# Patient Record
Sex: Female | Born: 1975 | Race: White | Hispanic: No | Marital: Married | Smoking: Never smoker
Health system: Southern US, Community
[De-identification: ages and names within clinical notes are randomized; demographics above are authoritative.]

## PROBLEM LIST (undated history)

## (undated) DIAGNOSIS — F419 Anxiety disorder, unspecified: Secondary | ICD-10-CM

## (undated) DIAGNOSIS — K8020 Calculus of gallbladder without cholecystitis without obstruction: Secondary | ICD-10-CM

## (undated) DIAGNOSIS — K219 Gastro-esophageal reflux disease without esophagitis: Secondary | ICD-10-CM

## (undated) DIAGNOSIS — F329 Major depressive disorder, single episode, unspecified: Secondary | ICD-10-CM

## (undated) HISTORY — DX: Anxiety disorder, unspecified: F41.9

## (undated) HISTORY — DX: Major depressive disorder, single episode, unspecified: F32.9

## (undated) HISTORY — DX: Calculus of gallbladder without cholecystitis without obstruction: K80.20

---

## 1998-05-27 ENCOUNTER — Other Ambulatory Visit: Admission: RE | Admit: 1998-05-27 | Discharge: 1998-05-27 | Payer: Self-pay | Admitting: Gynecology

## 1998-05-27 ENCOUNTER — Other Ambulatory Visit: Admission: RE | Admit: 1998-05-27 | Discharge: 1998-05-27 | Payer: Self-pay | Admitting: *Deleted

## 1998-11-21 ENCOUNTER — Other Ambulatory Visit: Admission: RE | Admit: 1998-11-21 | Discharge: 1998-11-21 | Payer: Self-pay | Admitting: Gynecology

## 2000-07-01 ENCOUNTER — Other Ambulatory Visit: Admission: RE | Admit: 2000-07-01 | Discharge: 2000-07-01 | Payer: Self-pay | Admitting: *Deleted

## 2001-05-25 HISTORY — PX: COLPOSCOPY: SHX161

## 2004-08-06 ENCOUNTER — Other Ambulatory Visit: Admission: RE | Admit: 2004-08-06 | Discharge: 2004-08-06 | Payer: Self-pay | Admitting: Obstetrics and Gynecology

## 2005-08-12 ENCOUNTER — Other Ambulatory Visit: Admission: RE | Admit: 2005-08-12 | Discharge: 2005-08-12 | Payer: Self-pay | Admitting: Obstetrics and Gynecology

## 2007-09-11 ENCOUNTER — Inpatient Hospital Stay (HOSPITAL_COMMUNITY): Admission: AD | Admit: 2007-09-11 | Discharge: 2007-09-13 | Payer: Self-pay | Admitting: Obstetrics and Gynecology

## 2009-10-25 ENCOUNTER — Inpatient Hospital Stay (HOSPITAL_COMMUNITY): Admission: AD | Admit: 2009-10-25 | Discharge: 2009-10-25 | Payer: Self-pay | Admitting: Obstetrics and Gynecology

## 2009-12-08 ENCOUNTER — Inpatient Hospital Stay (HOSPITAL_COMMUNITY): Admission: AD | Admit: 2009-12-08 | Discharge: 2009-12-09 | Payer: Self-pay | Admitting: Obstetrics and Gynecology

## 2010-05-25 DIAGNOSIS — F419 Anxiety disorder, unspecified: Secondary | ICD-10-CM

## 2010-05-25 DIAGNOSIS — F32A Depression, unspecified: Secondary | ICD-10-CM

## 2010-05-25 HISTORY — DX: Anxiety disorder, unspecified: F41.9

## 2010-05-25 HISTORY — DX: Depression, unspecified: F32.A

## 2010-08-09 LAB — CBC
HCT: 32 % — ABNORMAL LOW (ref 36.0–46.0)
HCT: 40.8 % (ref 36.0–46.0)
Hemoglobin: 10.9 g/dL — ABNORMAL LOW (ref 12.0–15.0)
Hemoglobin: 13.8 g/dL (ref 12.0–15.0)
MCH: 30.8 pg (ref 26.0–34.0)
MCH: 31.2 pg (ref 26.0–34.0)
MCHC: 33.9 g/dL (ref 30.0–36.0)
MCHC: 34.2 g/dL (ref 30.0–36.0)
MCV: 90.8 fL (ref 78.0–100.0)
MCV: 91.2 fL (ref 78.0–100.0)
Platelets: 222 10*3/uL (ref 150–400)
Platelets: 293 10*3/uL (ref 150–400)
RBC: 3.51 MIL/uL — ABNORMAL LOW (ref 3.87–5.11)
RBC: 4.49 MIL/uL (ref 3.87–5.11)
RDW: 14 % (ref 11.5–15.5)
RDW: 14.1 % (ref 11.5–15.5)
WBC: 12.9 10*3/uL — ABNORMAL HIGH (ref 4.0–10.5)
WBC: 13.3 10*3/uL — ABNORMAL HIGH (ref 4.0–10.5)

## 2010-08-09 LAB — RPR: RPR Ser Ql: NONREACTIVE

## 2010-08-11 LAB — URINALYSIS, ROUTINE W REFLEX MICROSCOPIC
Bilirubin Urine: NEGATIVE
Glucose, UA: NEGATIVE mg/dL
Hgb urine dipstick: NEGATIVE
Ketones, ur: NEGATIVE mg/dL
Nitrite: NEGATIVE
Protein, ur: NEGATIVE mg/dL
Specific Gravity, Urine: <1.005 — ABNORMAL LOW (ref 1.005–1.030)
Urobilinogen, UA: 0.2 mg/dL (ref 0.0–1.0)
pH: 6.5 (ref 5.0–8.0)

## 2010-08-11 LAB — FETAL FIBRONECTIN: Fetal Fibronectin: NEGATIVE

## 2011-02-17 LAB — CBC
HCT: 30.5 — ABNORMAL LOW
HCT: 37.5
Hemoglobin: 10.7 — ABNORMAL LOW
Hemoglobin: 13
MCHC: 34.8
MCHC: 35
MCV: 90.1
MCV: 90.5
Platelets: 242
Platelets: 301
RBC: 3.37 — ABNORMAL LOW
RBC: 4.16
RDW: 14
RDW: 14.1
WBC: 13.2 — ABNORMAL HIGH
WBC: 18.4 — ABNORMAL HIGH

## 2011-02-17 LAB — COMPREHENSIVE METABOLIC PANEL
ALT: 13
AST: 22
Albumin: 2.9 — ABNORMAL LOW
Alkaline Phosphatase: 157 — ABNORMAL HIGH
BUN: 8
CO2: 22
Calcium: 9.4
Chloride: 107
Creatinine, Ser: 0.65
GFR calc Af Amer: >60
GFR calc non Af Amer: >60
Glucose, Bld: 76
Potassium: 4
Sodium: 135
Total Bilirubin: 0.7
Total Protein: 6.2

## 2011-02-17 LAB — URINALYSIS, ROUTINE W REFLEX MICROSCOPIC
Bilirubin Urine: NEGATIVE
Glucose, UA: NEGATIVE
Hgb urine dipstick: NEGATIVE
Ketones, ur: NEGATIVE
Nitrite: NEGATIVE
Protein, ur: NEGATIVE
Specific Gravity, Urine: 1.01
Urobilinogen, UA: 0.2
pH: 7

## 2011-02-17 LAB — RPR: RPR Ser Ql: NONREACTIVE

## 2011-02-17 LAB — LACTATE DEHYDROGENASE: LDH: 148

## 2011-02-17 LAB — URIC ACID: Uric Acid, Serum: 6.3

## 2012-09-27 ENCOUNTER — Encounter (HOSPITAL_COMMUNITY): Payer: Self-pay | Admitting: Emergency Medicine

## 2012-09-27 ENCOUNTER — Emergency Department (HOSPITAL_COMMUNITY): Payer: BC Managed Care – PPO

## 2012-09-27 ENCOUNTER — Inpatient Hospital Stay (HOSPITAL_COMMUNITY)
Admission: EM | Admit: 2012-09-27 | Discharge: 2012-09-30 | DRG: 560 | Disposition: A | Discharge status: Home / Self Care | Payer: BC Managed Care – PPO | Attending: Surgery | Admitting: Surgery

## 2012-09-27 DIAGNOSIS — S22039A Unspecified fracture of third thoracic vertebra, initial encounter for closed fracture: Secondary | ICD-10-CM

## 2012-09-27 DIAGNOSIS — S22049A Unspecified fracture of fourth thoracic vertebra, initial encounter for closed fracture: Secondary | ICD-10-CM

## 2012-09-27 DIAGNOSIS — J942 Hemothorax: Secondary | ICD-10-CM | POA: Diagnosis present

## 2012-09-27 DIAGNOSIS — S22009A Unspecified fracture of unspecified thoracic vertebra, initial encounter for closed fracture: Principal | ICD-10-CM | POA: Diagnosis present

## 2012-09-27 DIAGNOSIS — S272XXA Traumatic hemopneumothorax, initial encounter: Secondary | ICD-10-CM | POA: Diagnosis present

## 2012-09-27 DIAGNOSIS — S199XXA Unspecified injury of neck, initial encounter: Secondary | ICD-10-CM

## 2012-09-27 LAB — CBC WITH DIFFERENTIAL/PLATELET
Band Neutrophils: 7 % (ref 0–10)
Basophils Absolute: 0 10*3/uL (ref 0.0–0.1)
Basophils Relative: 0 % (ref 0–1)
Blasts: 0 %
Eosinophils Absolute: 0 10*3/uL (ref 0.0–0.7)
Eosinophils Relative: 0 % (ref 0–5)
HCT: 37.4 % (ref 36.0–46.0)
Hemoglobin: 13.1 g/dL (ref 12.0–15.0)
Lymphocytes Relative: 5 % — ABNORMAL LOW (ref 12–46)
Lymphs Abs: 1.4 10*3/uL (ref 0.7–4.0)
MCH: 30 pg (ref 26.0–34.0)
MCHC: 35 g/dL (ref 30.0–36.0)
MCV: 85.8 fL (ref 78.0–100.0)
Metamyelocytes Relative: 0 %
Monocytes Absolute: 1.4 10*3/uL — ABNORMAL HIGH (ref 0.1–1.0)
Monocytes Relative: 5 % (ref 3–12)
Myelocytes: 2 %
Neutro Abs: 25.2 10*3/uL — ABNORMAL HIGH (ref 1.7–7.7)
Neutrophils Relative %: 81 % — ABNORMAL HIGH (ref 43–77)
Platelets: 307 10*3/uL (ref 150–400)
Promyelocytes Absolute: 0 %
RBC: 4.36 MIL/uL (ref 3.87–5.11)
RDW: 13.1 % (ref 11.5–15.5)
WBC: 28 10*3/uL — ABNORMAL HIGH (ref 4.0–10.5)
nRBC: 0 /100 WBC

## 2012-09-27 LAB — COMPREHENSIVE METABOLIC PANEL
ALT: 16 U/L (ref 0–35)
AST: 28 U/L (ref 0–37)
Albumin: 4.6 g/dL (ref 3.5–5.2)
Alkaline Phosphatase: 53 U/L (ref 39–117)
BUN: 15 mg/dL (ref 6–23)
CO2: 21 mEq/L (ref 19–32)
Calcium: 9 mg/dL (ref 8.4–10.5)
Chloride: 104 mEq/L (ref 96–112)
Creatinine, Ser: 0.7 mg/dL (ref 0.50–1.10)
GFR calc Af Amer: >90 mL/min (ref >90)
GFR calc non Af Amer: >90 mL/min (ref >90)
Glucose, Bld: 196 mg/dL — ABNORMAL HIGH (ref 70–99)
Potassium: 2.7 mEq/L — CL (ref 3.5–5.1)
Sodium: 139 mEq/L (ref 135–145)
Total Bilirubin: 1.1 mg/dL (ref 0.3–1.2)
Total Protein: 7.2 g/dL (ref 6.0–8.3)

## 2012-09-27 MED ORDER — ONDANSETRON HCL 4 MG/2ML IJ SOLN
4.0000 mg | Freq: Once | INTRAMUSCULAR | Status: AC
  Administered 2012-09-27: 4 mg via INTRAVENOUS

## 2012-09-27 MED ORDER — POTASSIUM CHLORIDE CRYS ER 20 MEQ PO TBCR
40.0000 meq | EXTENDED_RELEASE_TABLET | Freq: Once | ORAL | Status: AC
  Administered 2012-09-27: 40 meq via ORAL
  Filled 2012-09-27: qty 2

## 2012-09-27 MED ORDER — FENTANYL CITRATE 0.05 MG/ML IJ SOLN
50.0000 ug | Freq: Once | INTRAMUSCULAR | Status: AC
  Administered 2012-09-27: 50 ug via INTRAVENOUS
  Filled 2012-09-27: qty 2

## 2012-09-27 MED ORDER — SODIUM CHLORIDE 0.9 % IV BOLUS (SEPSIS)
500.0000 mL | Freq: Once | INTRAVENOUS | Status: AC
  Administered 2012-09-27: 500 mL via INTRAVENOUS

## 2012-09-27 MED ORDER — ONDANSETRON HCL 4 MG/2ML IJ SOLN
INTRAMUSCULAR | Status: AC
Start: 2012-09-27 — End: 2012-09-27
  Administered 2012-09-27: 21:00:00
  Filled 2012-09-27: qty 2

## 2012-09-27 MED ORDER — HYDROMORPHONE HCL PF 1 MG/ML IJ SOLN
1.0000 mg | Freq: Once | INTRAMUSCULAR | Status: AC
  Administered 2012-09-27: 1 mg via INTRAVENOUS
  Filled 2012-09-27: qty 1

## 2012-09-27 MED ORDER — ONDANSETRON HCL 4 MG/2ML IJ SOLN
4.0000 mg | Freq: Once | INTRAMUSCULAR | Status: AC
  Administered 2012-09-27: 4 mg via INTRAVENOUS
  Filled 2012-09-27: qty 2

## 2012-09-27 MED ORDER — IOHEXOL 300 MG/ML  SOLN
100.0000 mL | Freq: Once | INTRAMUSCULAR | Status: AC | PRN
  Administered 2012-09-27: 100 mL via INTRAVENOUS

## 2012-09-27 MED ORDER — POTASSIUM CHLORIDE 10 MEQ/100ML IV SOLN
10.0000 meq | INTRAVENOUS | Status: DC
  Administered 2012-09-27: 10 meq via INTRAVENOUS
  Filled 2012-09-27 (×2): qty 100

## 2012-09-27 MED ORDER — SODIUM CHLORIDE 0.9 % IV SOLN
INTRAVENOUS | Status: DC
  Administered 2012-09-27 – 2012-09-29 (×4): via INTRAVENOUS
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
## 2012-09-27 NOTE — ED Notes (Signed)
Critical lab potassium 2.7, Dr. Effie Shy notified.
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
## 2012-09-28 ENCOUNTER — Encounter (HOSPITAL_COMMUNITY): Payer: Self-pay | Admitting: Surgery

## 2012-09-28 DIAGNOSIS — S271XXA Traumatic hemothorax, initial encounter: Secondary | ICD-10-CM

## 2012-09-28 DIAGNOSIS — S22009A Unspecified fracture of unspecified thoracic vertebra, initial encounter for closed fracture: Secondary | ICD-10-CM

## 2012-09-28 LAB — CBC
HCT: 35.6 % — ABNORMAL LOW (ref 36.0–46.0)
Hemoglobin: 11.8 g/dL — ABNORMAL LOW (ref 12.0–15.0)
MCH: 28.6 pg (ref 26.0–34.0)
MCHC: 33.1 g/dL (ref 30.0–36.0)
MCV: 86.4 fL (ref 78.0–100.0)
Platelets: 275 10*3/uL (ref 150–400)
RBC: 4.12 MIL/uL (ref 3.87–5.11)
RDW: 13.4 % (ref 11.5–15.5)
WBC: 13.1 10*3/uL — ABNORMAL HIGH (ref 4.0–10.5)

## 2012-09-28 LAB — COMPREHENSIVE METABOLIC PANEL
ALT: 14 U/L (ref 0–35)
AST: 21 U/L (ref 0–37)
Albumin: 3.9 g/dL (ref 3.5–5.2)
Alkaline Phosphatase: 48 U/L (ref 39–117)
BUN: 13 mg/dL (ref 6–23)
CO2: 23 mEq/L (ref 19–32)
Calcium: 9.1 mg/dL (ref 8.4–10.5)
Chloride: 108 mEq/L (ref 96–112)
Creatinine, Ser: 0.65 mg/dL (ref 0.50–1.10)
GFR calc Af Amer: >90 mL/min (ref >90)
GFR calc non Af Amer: >90 mL/min (ref >90)
Glucose, Bld: 111 mg/dL — ABNORMAL HIGH (ref 70–99)
Potassium: 3.8 mEq/L (ref 3.5–5.1)
Sodium: 139 mEq/L (ref 135–145)
Total Bilirubin: 1.2 mg/dL (ref 0.3–1.2)
Total Protein: 6.7 g/dL (ref 6.0–8.3)

## 2012-09-28 LAB — URINALYSIS, ROUTINE W REFLEX MICROSCOPIC
Bilirubin Urine: NEGATIVE
Glucose, UA: NEGATIVE mg/dL
Hgb urine dipstick: NEGATIVE
Ketones, ur: 40 mg/dL — AB
Leukocytes, UA: NEGATIVE
Nitrite: NEGATIVE
Protein, ur: NEGATIVE mg/dL
Specific Gravity, Urine: >1.046 — ABNORMAL HIGH (ref 1.005–1.030)
Urobilinogen, UA: 0.2 mg/dL (ref 0.0–1.0)
pH: 5.5 (ref 5.0–8.0)

## 2012-09-28 MED ORDER — LORAZEPAM BOLUS VIA INFUSION: 1.0000 mg | INTRAVENOUS | Status: DC | PRN

## 2012-09-28 MED ORDER — LORAZEPAM 2 MG/ML IJ SOLN
1.0000 mg | INTRAMUSCULAR | Status: DC | PRN
  Administered 2012-09-28 – 2012-09-29 (×3): 1 mg via INTRAVENOUS
  Filled 2012-09-28 (×3): qty 1

## 2012-09-28 MED ORDER — ONDANSETRON HCL 4 MG PO TABS
4.0000 mg | ORAL_TABLET | Freq: Four times a day (QID) | ORAL | Status: DC | PRN
  Administered 2012-09-30: 4 mg via ORAL
  Filled 2012-09-28: qty 1

## 2012-09-28 MED ORDER — ONDANSETRON HCL 4 MG/2ML IJ SOLN
4.0000 mg | Freq: Four times a day (QID) | INTRAMUSCULAR | Status: DC | PRN
  Administered 2012-09-28 – 2012-09-30 (×3): 4 mg via INTRAVENOUS
  Filled 2012-09-28 (×5): qty 2

## 2012-09-28 MED ORDER — HYDROMORPHONE HCL PF 1 MG/ML IJ SOLN
1.0000 mg | INTRAMUSCULAR | Status: AC
  Administered 2012-09-28: 1 mg via INTRAVENOUS
  Filled 2012-09-28: qty 1

## 2012-09-28 MED ORDER — MORPHINE SULFATE 2 MG/ML IJ SOLN
2.0000 mg | INTRAMUSCULAR | Status: DC | PRN
  Administered 2012-09-28 – 2012-09-30 (×4): 2 mg via INTRAVENOUS
  Filled 2012-09-28 (×4): qty 1

## 2012-09-28 MED ORDER — PANTOPRAZOLE SODIUM 40 MG PO TBEC
40.0000 mg | DELAYED_RELEASE_TABLET | Freq: Every day | ORAL | Status: DC
  Administered 2012-09-29 – 2012-09-30 (×2): 40 mg via ORAL
  Filled 2012-09-28 (×3): qty 1

## 2012-09-28 MED ORDER — HYDROMORPHONE HCL PF 1 MG/ML IJ SOLN
1.0000 mg | INTRAMUSCULAR | Status: DC | PRN
  Administered 2012-09-28: 1 mg via INTRAVENOUS

## 2012-09-28 MED ORDER — PANTOPRAZOLE SODIUM 40 MG IV SOLR
40.0000 mg | Freq: Every day | INTRAVENOUS | Status: DC
  Administered 2012-09-28: 40 mg via INTRAVENOUS
  Filled 2012-09-28 (×3): qty 40

## 2012-09-28 MED ORDER — OXYCODONE HCL 5 MG PO TABS
10.0000 mg | ORAL_TABLET | ORAL | Status: DC | PRN
  Administered 2012-09-28 – 2012-09-30 (×9): 10 mg via ORAL
  Filled 2012-09-28 (×9): qty 2

## 2012-09-28 MED ORDER — HYDROMORPHONE HCL PF 1 MG/ML IJ SOLN: 1.0000 mg | INTRAMUSCULAR | Status: DC | PRN

## 2012-09-28 MED ORDER — ONDANSETRON HCL 4 MG/2ML IJ SOLN
INTRAMUSCULAR | Status: AC
  Administered 2012-09-28: 4 mg via INTRAVENOUS
  Filled 2012-09-28: qty 2

## 2012-09-28 MED ORDER — ONDANSETRON HCL 4 MG/2ML IJ SOLN: 4.0000 mg | Freq: Once | INTRAMUSCULAR | Status: AC

## 2012-09-28 MED ORDER — HYDROMORPHONE HCL PF 1 MG/ML IJ SOLN
1.0000 mg | INTRAMUSCULAR | Status: DC | PRN
  Filled 2012-09-28: qty 1

## 2012-09-28 NOTE — Progress Notes (Signed)
Patient admitted early this morning.  To get repeat CT with higher resolution and finer cuts.  Nausea with pain medication.  Unlikely to require surgery, so will allow the patient to eat.  Marta Lamas. Gae Bon, MD, FACS [phone] Trauma Surgeon

## 2012-09-28 NOTE — Progress Notes (Signed)
UR completed 

## 2012-09-28 NOTE — Progress Notes (Signed)
Patient ID: Stacey Foster, female   DOB: Oct 14, 1975, 37 y.o.   MRN: [id number] Ct thoracic seen. No need for surgery . On an aspen brace . Will get the thoracic extension. Spoke with her and parents. i wii repeat the ct thoracic in 3 weeks. Ct seen with drs Cabbell and Lovell Sheehan
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
## 2012-09-28 NOTE — Progress Notes (Signed)
Patient ID: Stacey Foster, female   DOB: 1976-01-31, 37 y.o.   MRN: [id number] Neuro stable. C/o soreness all over. No neuro abnormalities. Ct thoracic spine ordered by trauma to be sure of the fractures and the need for surgical intervention.
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
## 2012-09-29 ENCOUNTER — Inpatient Hospital Stay (HOSPITAL_COMMUNITY): Payer: BC Managed Care – PPO

## 2012-09-29 MED ORDER — TIZANIDINE HCL 4 MG PO TABS
4.0000 mg | ORAL_TABLET | Freq: Three times a day (TID) | ORAL | Status: DC | PRN
  Filled 2012-09-29: qty 1
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
## 2012-09-29 NOTE — Progress Notes (Signed)
Patient ID: Stacey Foster, female   DOB: April 28, 1976, 37 y.o.   MRN: [id number] Neuro stable. No weakness. To get the thoracic extension for the aspen collar. Pt to get her oob. Lateral ap xrays of thoracic spine. If normal she can go home with the brace and i will see he in 4 weeks.
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
## 2012-09-29 NOTE — Progress Notes (Signed)
Orthopedic Tech Progress Note Patient Details:  Stacey Foster [birth year] [id number]  Patient ID: Brand Males, female   DOB: December 04, 1975, 37 y.o.   MRN: [id number]   [name] 09/29/2012, 2:07 PM CALLED [facility] FOR CTO BRACE.
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
## 2012-09-29 NOTE — Progress Notes (Signed)
Orthopedic Tech Progress Note Patient Details:  Stacey Foster [birth year] [id number]  Patient ID: Brand Males, female   DOB: [birth year], 37 y.o.   MRN: [id number]   [name] 09/29/2012, 2:08 PM CTO COMPLETED BY [facility] .

## 2012-09-30 ENCOUNTER — Inpatient Hospital Stay (HOSPITAL_COMMUNITY): Payer: BC Managed Care – PPO

## 2012-09-30 MED ORDER — ACETAMINOPHEN 325 MG PO TABS: 650.0000 mg | ORAL_TABLET | Freq: Four times a day (QID) | ORAL | Status: DC | PRN

## 2012-09-30 MED ORDER — ONDANSETRON HCL 4 MG PO TABS: 4.0000 mg | ORAL_TABLET | Freq: Four times a day (QID) | ORAL | Status: DC | PRN

## 2012-09-30 MED ORDER — TIZANIDINE HCL 4 MG PO TABS: 4.0000 mg | ORAL_TABLET | Freq: Three times a day (TID) | ORAL | Status: DC | PRN

## 2012-09-30 MED ORDER — OXYCODONE HCL 10 MG PO TABS: 5.0000 mg | ORAL_TABLET | ORAL | Status: DC | PRN
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
## 2012-09-30 NOTE — Progress Notes (Signed)
Patient ID: Stacey Foster, female   DOB: [birth year], 37 y.o.   MRN: [id number] Doing well. C/o neck pain. Neuro normal. Thoracic spine xrays shows good aligment. Spoke to her and mother and answered their ? Ok to be discharge with pain medication and muscle relaxres. To see me in 4 weeks. Will order a ct spine prior to the visit
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
## 2012-09-30 NOTE — Progress Notes (Signed)
She is doing fine.  Probably home after X-rays of T-spine. This patient has been seen and I agree with the findings and treatment plan.  Marta Lamas. Gae Bon, MD, FACS [phone] (pager) [phone] (direct pager) Trauma Surgeon

## 2012-09-30 NOTE — Progress Notes (Signed)
Patient discharge education complete and prescription for narcotic pain medication given.  IV removed and patient leaving with mother and husband. Lance Bosch, RN

## 2012-09-30 NOTE — Progress Notes (Signed)
Occupational Therapy Treatment Patient Details Name: ESSA WENK MRN: [id number] DOB: [birth year] Today's Date: 09/30/2012 Time: 7829-5621 OT Time Calculation (min): 14 min  OT Assessment / Plan / Recommendation Comments on Treatment Session Goals met.  Pt preparing to d/c later today. Signing off.    Follow Up Recommendations  No OT follow up;Supervision/Assistance - 24 hour    Barriers to Discharge       Equipment Recommendations  None recommended by OT    Recommendations for Other Services    Frequency Min 2X/week   Plan All goals met and education completed, patient discharged from OT services;Discharge plan remains appropriate    Precautions / Restrictions Precautions Precautions: Cervical;Back Precaution Comments: Pt educated on cervical/back precautions to assist in minimizing pain. Required Braces or Orthoses: Cervical Brace Cervical Brace: Hard collar (with thoracic extension)   Pertinent Vitals/Pain See vitals    ADL  Eating/Feeding: Performed;Modified independent Where Assessed - Eating/Feeding: Edge of bed Grooming: Performed;Brushing hair;Wash/dry face;Modified independent Where Assessed - Grooming: Unsupported standing Toilet Transfer: Simulated;Modified independent Toilet Transfer Method: Sit to Barista:  (bed) Equipment Used:  (cervical collar with thoracic extension) Transfers/Ambulation Related to ADLs: mod I within room ADL Comments: Wearing schedule for cervical collar w/ thoracic ext. not specified in chart orders or notes. RN Tobi Bastos called Dr Cassandria Santee office and received confirmation that pt only needs to wear brace when up ambulating.  Pt able to don/doff brace in sitting with min assist and her mother independent in assisting.  Edcuated pt/mother on adjusting/repositioning brace by tightening or loosing side straps as needed. Otherwise brace settings fixed specifically for pt. Pt verbalized understanding.  Also discussed body  positioning in bed when on side and on back with use of pillows to maximize comfort and support.     OT Diagnosis:    OT Problem List:   OT Treatment Interventions:     OT Goals Miscellaneous OT Goals Miscellaneous OT Goal #1: Pt will be able to don/doff cervical collar wtih thoracic extension with min assist and caregiver I in assisting.  OT Goal: Miscellaneous Goal #1 - Progress: Met  Visit Information  Last OT Received On: 09/30/12    Subjective Data      Prior Functioning       Cognition  Cognition Arousal/Alertness: Awake/alert Behavior During Therapy: WFL for tasks assessed/performed Overall Cognitive Status: Within Functional Limits for tasks assessed    Mobility  Bed Mobility Bed Mobility: Not assessed Transfers Transfers: Sit to Stand;Stand to Sit Sit to Stand: 6: Modified independent (Device/Increase time);From bed Stand to Sit: 6: Modified independent (Device/Increase time);To bed    Exercises      Balance     End of Session OT - End of Session Equipment Utilized During Treatment: Cervical collar Activity Tolerance: Patient tolerated treatment well Patient left: in bed;with call bell/phone within reach;with family/visitor present;with nursing in room Nurse Communication:  (brace wearing schedule)  GO    09/30/2012 Cipriano Mile OTR/L Pager [phone] Office [phone]  Cipriano Mile 09/30/2012, 12:06 PM

## 2012-10-31 ENCOUNTER — Other Ambulatory Visit: Payer: Self-pay | Admitting: Neurosurgery

## 2012-10-31 DIAGNOSIS — S22000S Wedge compression fracture of unspecified thoracic vertebra, sequela: Secondary | ICD-10-CM

## 2012-11-08 ENCOUNTER — Ambulatory Visit
Admission: RE | Admit: 2012-11-08 | Discharge: 2012-11-08 | Disposition: A | Discharge status: Home / Self Care | Payer: BC Managed Care – PPO | Source: Ambulatory Visit | Attending: Neurosurgery | Admitting: Neurosurgery

## 2012-11-08 DIAGNOSIS — S22000S Wedge compression fracture of unspecified thoracic vertebra, sequela: Secondary | ICD-10-CM

## 2012-11-18 ENCOUNTER — Other Ambulatory Visit: Payer: Self-pay | Admitting: Neurosurgery

## 2012-11-18 DIAGNOSIS — S22009A Unspecified fracture of unspecified thoracic vertebra, initial encounter for closed fracture: Secondary | ICD-10-CM

## 2012-12-13 ENCOUNTER — Other Ambulatory Visit: Payer: BC Managed Care – PPO

## 2012-12-15 ENCOUNTER — Ambulatory Visit
Admission: RE | Admit: 2012-12-15 | Discharge: 2012-12-15 | Disposition: A | Discharge status: Home / Self Care | Payer: BC Managed Care – PPO | Source: Ambulatory Visit | Attending: Neurosurgery | Admitting: Neurosurgery

## 2012-12-15 DIAGNOSIS — S22009A Unspecified fracture of unspecified thoracic vertebra, initial encounter for closed fracture: Secondary | ICD-10-CM

## 2013-02-08 ENCOUNTER — Encounter: Payer: Self-pay | Admitting: Obstetrics and Gynecology

## 2013-02-09 ENCOUNTER — Other Ambulatory Visit: Payer: Self-pay | Admitting: Neurosurgery

## 2013-02-14 ENCOUNTER — Ambulatory Visit
Admission: RE | Admit: 2013-02-14 | Discharge: 2013-02-14 | Disposition: A | Discharge status: Home / Self Care | Payer: BC Managed Care – PPO | Source: Ambulatory Visit | Attending: Neurosurgery | Admitting: Neurosurgery

## 2013-03-02 ENCOUNTER — Other Ambulatory Visit: Payer: Self-pay | Admitting: Neurosurgery

## 2013-03-02 DIAGNOSIS — M549 Dorsalgia, unspecified: Secondary | ICD-10-CM

## 2013-03-02 DIAGNOSIS — M542 Cervicalgia: Secondary | ICD-10-CM

## 2013-03-04 ENCOUNTER — Ambulatory Visit
Admission: RE | Admit: 2013-03-04 | Discharge: 2013-03-04 | Disposition: A | Discharge status: Home / Self Care | Payer: BC Managed Care – PPO | Source: Ambulatory Visit | Attending: Neurosurgery | Admitting: Neurosurgery

## 2013-03-04 DIAGNOSIS — M549 Dorsalgia, unspecified: Secondary | ICD-10-CM

## 2013-03-04 DIAGNOSIS — M542 Cervicalgia: Secondary | ICD-10-CM

## 2013-03-17 ENCOUNTER — Ambulatory Visit (INDEPENDENT_AMBULATORY_CARE_PROVIDER_SITE_OTHER): Payer: BC Managed Care – PPO | Admitting: Obstetrics and Gynecology

## 2013-03-17 ENCOUNTER — Encounter: Payer: Self-pay | Admitting: Obstetrics and Gynecology

## 2013-03-17 VITALS — BP 120/76 | HR 76 | Resp 16 | Ht 65.5 in | Wt 153.5 lb

## 2013-03-17 DIAGNOSIS — Z01419 Encounter for gynecological examination (general) (routine) without abnormal findings: Secondary | ICD-10-CM

## 2013-03-17 DIAGNOSIS — Z0000 Encounter for general adult medical examination without abnormal findings: Secondary | ICD-10-CM

## 2013-03-17 LAB — POCT URINALYSIS DIPSTICK
Bilirubin, UA: NEGATIVE
Blood, UA: NEGATIVE
Glucose, UA: NEGATIVE
Ketones, UA: NEGATIVE
Leukocytes, UA: NEGATIVE
Nitrite, UA: NEGATIVE
Protein, UA: NEGATIVE
Urobilinogen, UA: NEGATIVE
pH, UA: 5

## 2013-03-17 LAB — HEMOGLOBIN, FINGERSTICK: Hemoglobin, fingerstick: 13.7 g/dL (ref 12.0–16.0)
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
## 2013-03-17 NOTE — Patient Instructions (Signed)

## 2013-03-20 ENCOUNTER — Encounter: Payer: Self-pay | Admitting: Obstetrics and Gynecology

## 2013-03-21 LAB — IPS PAP TEST WITH HPV

## 2013-08-18 ENCOUNTER — Other Ambulatory Visit: Payer: Self-pay | Admitting: Neurosurgery

## 2013-08-18 ENCOUNTER — Ambulatory Visit
Admission: RE | Admit: 2013-08-18 | Discharge: 2013-08-18 | Disposition: A | Discharge status: Home / Self Care | Payer: BC Managed Care – PPO | Source: Ambulatory Visit | Attending: Neurosurgery | Admitting: Neurosurgery

## 2013-08-18 DIAGNOSIS — S22009A Unspecified fracture of unspecified thoracic vertebra, initial encounter for closed fracture: Secondary | ICD-10-CM

## 2014-01-23 ENCOUNTER — Encounter: Payer: Self-pay | Admitting: Obstetrics and Gynecology

## 2014-03-19 ENCOUNTER — Ambulatory Visit (INDEPENDENT_AMBULATORY_CARE_PROVIDER_SITE_OTHER): Payer: BC Managed Care – PPO | Admitting: Obstetrics and Gynecology

## 2014-03-19 ENCOUNTER — Ambulatory Visit: Payer: BC Managed Care – PPO | Admitting: Obstetrics and Gynecology

## 2014-03-19 ENCOUNTER — Encounter: Payer: Self-pay | Admitting: Obstetrics and Gynecology

## 2014-03-19 VITALS — BP 110/60 | HR 84 | Resp 18 | Ht 65.25 in | Wt 144.6 lb

## 2014-03-19 DIAGNOSIS — Z0000 Encounter for general adult medical examination without abnormal findings: Secondary | ICD-10-CM

## 2014-03-19 DIAGNOSIS — Z01419 Encounter for gynecological examination (general) (routine) without abnormal findings: Secondary | ICD-10-CM

## 2014-03-19 LAB — POCT URINALYSIS DIPSTICK
Bilirubin, UA: NEGATIVE
Blood, UA: NEGATIVE
Glucose, UA: NEGATIVE
Ketones, UA: NEGATIVE
Leukocytes, UA: NEGATIVE
Nitrite, UA: NEGATIVE
Protein, UA: NEGATIVE
Urobilinogen, UA: NEGATIVE
pH, UA: 5
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
## 2014-03-19 NOTE — Progress Notes (Signed)
See note below

## 2014-03-19 NOTE — Patient Instructions (Signed)

## 2014-03-20 ENCOUNTER — Encounter: Payer: Self-pay | Admitting: Obstetrics and Gynecology

## 2014-03-23 LAB — IPS PAP TEST WITH HPV

## 2014-03-26 ENCOUNTER — Encounter: Payer: Self-pay | Admitting: Obstetrics and Gynecology

## 2014-03-27 ENCOUNTER — Telehealth: Payer: Self-pay

## 2014-03-27 MED ORDER — DOXYCYCLINE HYCLATE 100 MG PO CAPS: 100.0000 mg | ORAL_CAPSULE | Freq: Two times a day (BID) | ORAL | Status: DC

## 2014-03-27 NOTE — Telephone Encounter (Signed)
Returning call.

## 2014-03-27 NOTE — Telephone Encounter (Signed)
Left message to call Mattisyn Cardona at [phone]. 

## 2014-03-27 NOTE — Telephone Encounter (Signed)
-----   Message from AbieBrook E Amundson de Gwenevere Ghaziarvalho E Silva, MD sent at 03/26/2014  6:11 PM EST ----- Please contact patient with her pap results.  This year, the cytopathologist is commenting on inflammation again.  No precancer or cancer.  HPV is negative.  She had this inflammation last year as well. I am going to recommend treatment with Doxycycline 100 mg po bid for one week.  Take with food.  Dispense: 14, RF zero.  Please send to her pharmacy.   Please enter pap recall - 08.  Cc - Claudette LawsAmanda Dixon.

## 2014-03-27 NOTE — Telephone Encounter (Signed)
Spoke with patient. Advised patient of message and results as seen below from Dr.Silva. Patient is agreeable and verbalizes understanding. Rx for Doxycycline 100 mg po bid for one week #14 0RF sent to pharmacy on file. Patient would like to schedule aex for next year at this time. Requesting Monday or Friday appointment between 9am and 1pm. Appointment scheduled for 10/28 at 11:30am with Dr.Silva. Patient is agreeable to date and time.  Routing to provider for final review. Patient agreeable to disposition. Will close encounter

## 2014-10-02 ENCOUNTER — Other Ambulatory Visit: Payer: Self-pay | Admitting: Neurosurgery

## 2014-10-02 DIAGNOSIS — M5415 Radiculopathy, thoracolumbar region: Secondary | ICD-10-CM

## 2014-10-12 ENCOUNTER — Ambulatory Visit
Admission: RE | Admit: 2014-10-12 | Discharge: 2014-10-12 | Disposition: A | Discharge status: Home / Self Care | Payer: BLUE CROSS/BLUE SHIELD | Source: Ambulatory Visit | Attending: Neurosurgery | Admitting: Neurosurgery

## 2014-10-12 DIAGNOSIS — M5415 Radiculopathy, thoracolumbar region: Secondary | ICD-10-CM

## 2014-10-12 MED ORDER — MEPERIDINE HCL 100 MG/ML IJ SOLN
50.0000 mg | Freq: Once | INTRAMUSCULAR | Status: AC
  Administered 2014-10-12: 50 mg via INTRAMUSCULAR

## 2014-10-12 MED ORDER — IOHEXOL 300 MG/ML  SOLN
10.0000 mL | Freq: Once | INTRAMUSCULAR | Status: AC | PRN
  Administered 2014-10-12: 10 mL via INTRATHECAL

## 2014-10-12 MED ORDER — DIAZEPAM 5 MG PO TABS
10.0000 mg | ORAL_TABLET | Freq: Once | ORAL | Status: AC
Start: 2014-10-12 — End: 2014-10-12
  Administered 2014-10-12: 10 mg via ORAL

## 2014-10-12 MED ORDER — ONDANSETRON HCL 4 MG/2ML IJ SOLN
4.0000 mg | Freq: Once | INTRAMUSCULAR | Status: AC
Start: 2014-10-12 — End: 2014-10-12
  Administered 2014-10-12: 4 mg via INTRAMUSCULAR

## 2014-10-12 NOTE — Discharge Instructions (Signed)

## 2014-10-16 ENCOUNTER — Ambulatory Visit
Admission: RE | Admit: 2014-10-16 | Discharge: 2014-10-16 | Disposition: A | Discharge status: Home / Self Care | Payer: BLUE CROSS/BLUE SHIELD | Source: Ambulatory Visit | Attending: Neurosurgery | Admitting: Neurosurgery

## 2014-10-16 ENCOUNTER — Other Ambulatory Visit: Payer: Self-pay | Admitting: Neurosurgery

## 2014-10-16 DIAGNOSIS — G971 Other reaction to spinal and lumbar puncture: Secondary | ICD-10-CM

## 2014-10-16 MED ORDER — IOHEXOL 180 MG/ML  SOLN
1.0000 mL | Freq: Once | INTRAMUSCULAR | Status: AC | PRN
  Administered 2014-10-16: 1 mL via EPIDURAL

## 2014-10-16 NOTE — Progress Notes (Signed)
20cc blood drawn from left AC space for epidural blood patch; site unremarkable.  Shaneequa Bahner, RN 

## 2014-10-16 NOTE — Discharge Instructions (Signed)

## 2015-02-15 ENCOUNTER — Telehealth: Payer: Self-pay | Admitting: Obstetrics and Gynecology

## 2015-02-15 DIAGNOSIS — Z30433 Encounter for removal and reinsertion of intrauterine contraceptive device: Secondary | ICD-10-CM

## 2015-02-15 NOTE — Telephone Encounter (Signed)
Patient called and said, "I have a Mirena that is due to be taken out and I'll need a new one put in." FYI--Patient has an AEX 03/22/15 and her insurance on file is correct.

## 2015-02-15 NOTE — Telephone Encounter (Signed)
Okay to proceed with precert. Orders placed for Mirena removal and reinsertion. Must be performed before October 2016. If she is unable to come in before October 2016 she can have this performed with her cycle. Cannot be performed at aex since it is a removal and reinsertion and will need more time. Thank you!

## 2015-02-15 NOTE — Telephone Encounter (Signed)
Is it ok to proceed with precert for this patient? Can the appointment take place during her upcoming aex?

## 2015-02-22 NOTE — Telephone Encounter (Signed)
Patient calling to check on pre-cert for Mirena IUD aware becky will give her a call as soon as she can.

## 2015-02-25 ENCOUNTER — Other Ambulatory Visit: Payer: Self-pay | Admitting: Family Medicine

## 2015-02-25 DIAGNOSIS — R1011 Right upper quadrant pain: Secondary | ICD-10-CM

## 2015-02-26 ENCOUNTER — Ambulatory Visit
Admission: RE | Admit: 2015-02-26 | Discharge: 2015-02-26 | Disposition: A | Discharge status: Home / Self Care | Payer: BLUE CROSS/BLUE SHIELD | Source: Ambulatory Visit | Attending: Family Medicine | Admitting: Family Medicine

## 2015-02-26 DIAGNOSIS — R1011 Right upper quadrant pain: Secondary | ICD-10-CM

## 2015-02-26 NOTE — Telephone Encounter (Signed)
Mirena removal and reinsertion scheduled for 03-04-15 at 2pm.  Patient again requested to have this done at annual exam. Advised could not do procedure at annual due to time constraints but can reschedule annual exam and combine with IUD recheck 6 weeks after insertion. Annual exam rescheduled to 04-12-15. Instructed to take Motrin 800 mg one hour prior with food.  Last annual exam 03-19-14.  Dr Edward Jolly, please review plan. Is this ok as above?

## 2015-02-26 NOTE — Telephone Encounter (Signed)
Reviewed benefit with patient. Patient agreeable and ready to schedule. Transferred to nurse to schedule.

## 2015-02-27 NOTE — Telephone Encounter (Signed)
Reviewed with Dr Edward Jolly and agreeable to plan as scheduled. Encounter closed.

## 2015-03-04 ENCOUNTER — Encounter: Payer: Self-pay | Admitting: Obstetrics and Gynecology

## 2015-03-04 ENCOUNTER — Ambulatory Visit (INDEPENDENT_AMBULATORY_CARE_PROVIDER_SITE_OTHER): Payer: BLUE CROSS/BLUE SHIELD | Admitting: Obstetrics and Gynecology

## 2015-03-04 DIAGNOSIS — Z30433 Encounter for removal and reinsertion of intrauterine contraceptive device: Secondary | ICD-10-CM

## 2015-03-04 NOTE — Patient Instructions (Signed)
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
## 2015-03-22 ENCOUNTER — Ambulatory Visit: Payer: BC Managed Care – PPO | Admitting: Obstetrics and Gynecology

## 2015-04-12 ENCOUNTER — Ambulatory Visit (INDEPENDENT_AMBULATORY_CARE_PROVIDER_SITE_OTHER): Payer: BLUE CROSS/BLUE SHIELD | Admitting: Obstetrics and Gynecology

## 2015-04-12 ENCOUNTER — Encounter: Payer: Self-pay | Admitting: Obstetrics and Gynecology

## 2015-04-12 VITALS — BP 118/82 | HR 64 | Resp 14 | Ht 65.5 in | Wt 145.8 lb

## 2015-04-12 DIAGNOSIS — Z01419 Encounter for gynecological examination (general) (routine) without abnormal findings: Secondary | ICD-10-CM

## 2015-04-12 DIAGNOSIS — N644 Mastodynia: Secondary | ICD-10-CM | POA: Diagnosis not present

## 2015-04-12 LAB — POCT URINALYSIS DIPSTICK
Bilirubin, UA: NEGATIVE
Blood, UA: NEGATIVE
Glucose, UA: NEGATIVE
Ketones, UA: NEGATIVE
Leukocytes, UA: NEGATIVE
Nitrite, UA: NEGATIVE
Protein, UA: NEGATIVE
Urobilinogen, UA: NEGATIVE
pH, UA: 5

## 2015-04-12 NOTE — Patient Instructions (Signed)
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
## 2015-04-12 NOTE — Progress Notes (Signed)
Scheduled patient while in office for bilateral diagnostic with bilateral ultrasounds. [facility]

## 2015-04-29 ENCOUNTER — Ambulatory Visit
Admission: RE | Admit: 2015-04-29 | Discharge: 2015-04-29 | Disposition: A | Discharge status: Home / Self Care | Payer: BLUE CROSS/BLUE SHIELD | Source: Ambulatory Visit | Attending: Obstetrics and Gynecology | Admitting: Obstetrics and Gynecology

## 2015-04-29 DIAGNOSIS — N644 Mastodynia: Secondary | ICD-10-CM

## 2015-10-10 DIAGNOSIS — R945 Abnormal results of liver function studies: Secondary | ICD-10-CM | POA: Diagnosis not present

## 2015-10-10 DIAGNOSIS — R5381 Other malaise: Secondary | ICD-10-CM | POA: Diagnosis not present

## 2015-10-18 DIAGNOSIS — Z6823 Body mass index (BMI) 23.0-23.9, adult: Secondary | ICD-10-CM | POA: Diagnosis not present

## 2015-10-18 DIAGNOSIS — K219 Gastro-esophageal reflux disease without esophagitis: Secondary | ICD-10-CM | POA: Diagnosis not present

## 2015-10-18 DIAGNOSIS — R945 Abnormal results of liver function studies: Secondary | ICD-10-CM | POA: Diagnosis not present

## 2015-10-18 DIAGNOSIS — M549 Dorsalgia, unspecified: Secondary | ICD-10-CM | POA: Diagnosis not present

## 2015-12-03 DIAGNOSIS — G8929 Other chronic pain: Secondary | ICD-10-CM | POA: Diagnosis not present

## 2015-12-03 DIAGNOSIS — M546 Pain in thoracic spine: Secondary | ICD-10-CM | POA: Diagnosis not present

## 2015-12-03 DIAGNOSIS — M791 Myalgia: Secondary | ICD-10-CM | POA: Diagnosis not present

## 2016-03-05 DIAGNOSIS — Z23 Encounter for immunization: Secondary | ICD-10-CM | POA: Diagnosis not present

## 2016-03-06 DIAGNOSIS — M546 Pain in thoracic spine: Secondary | ICD-10-CM | POA: Diagnosis not present

## 2016-03-06 DIAGNOSIS — G8929 Other chronic pain: Secondary | ICD-10-CM | POA: Diagnosis not present

## 2016-03-06 DIAGNOSIS — M791 Myalgia: Secondary | ICD-10-CM | POA: Diagnosis not present
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
## 2016-04-24 ENCOUNTER — Encounter: Payer: Self-pay | Admitting: Obstetrics and Gynecology

## 2016-04-24 ENCOUNTER — Ambulatory Visit (INDEPENDENT_AMBULATORY_CARE_PROVIDER_SITE_OTHER): Payer: BLUE CROSS/BLUE SHIELD | Admitting: Obstetrics and Gynecology

## 2016-04-24 VITALS — BP 110/76 | HR 84 | Resp 14 | Ht 65.5 in | Wt 149.6 lb

## 2016-04-24 DIAGNOSIS — Z0000 Encounter for general adult medical examination without abnormal findings: Secondary | ICD-10-CM | POA: Diagnosis not present

## 2016-04-24 DIAGNOSIS — Z01419 Encounter for gynecological examination (general) (routine) without abnormal findings: Secondary | ICD-10-CM | POA: Diagnosis not present

## 2016-04-24 LAB — POCT URINALYSIS DIPSTICK
Bilirubin, UA: NEGATIVE
Blood, UA: NEGATIVE
Glucose, UA: NEGATIVE
Ketones, UA: NEGATIVE
Leukocytes, UA: NEGATIVE
Nitrite, UA: NEGATIVE
Protein, UA: NEGATIVE
Urobilinogen, UA: NEGATIVE
pH, UA: 5

## 2016-04-24 NOTE — Patient Instructions (Signed)

## 2016-06-30 DIAGNOSIS — L929 Granulomatous disorder of the skin and subcutaneous tissue, unspecified: Secondary | ICD-10-CM | POA: Diagnosis not present

## 2016-07-07 DIAGNOSIS — L929 Granulomatous disorder of the skin and subcutaneous tissue, unspecified: Secondary | ICD-10-CM | POA: Diagnosis not present

## 2016-07-13 DIAGNOSIS — D225 Melanocytic nevi of trunk: Secondary | ICD-10-CM | POA: Diagnosis not present

## 2016-07-13 DIAGNOSIS — Z6824 Body mass index (BMI) 24.0-24.9, adult: Secondary | ICD-10-CM | POA: Diagnosis not present

## 2016-07-13 DIAGNOSIS — L929 Granulomatous disorder of the skin and subcutaneous tissue, unspecified: Secondary | ICD-10-CM | POA: Diagnosis not present

## 2016-07-13 DIAGNOSIS — Z86018 Personal history of other benign neoplasm: Secondary | ICD-10-CM | POA: Diagnosis not present

## 2016-07-13 DIAGNOSIS — L814 Other melanin hyperpigmentation: Secondary | ICD-10-CM | POA: Diagnosis not present

## 2016-07-13 DIAGNOSIS — Z0000 Encounter for general adult medical examination without abnormal findings: Secondary | ICD-10-CM | POA: Diagnosis not present

## 2016-07-13 DIAGNOSIS — Z136 Encounter for screening for cardiovascular disorders: Secondary | ICD-10-CM | POA: Diagnosis not present

## 2016-07-13 DIAGNOSIS — D1801 Hemangioma of skin and subcutaneous tissue: Secondary | ICD-10-CM | POA: Diagnosis not present

## 2016-07-20 DIAGNOSIS — Z0000 Encounter for general adult medical examination without abnormal findings: Secondary | ICD-10-CM | POA: Diagnosis not present

## 2016-07-20 DIAGNOSIS — Z23 Encounter for immunization: Secondary | ICD-10-CM | POA: Diagnosis not present

## 2016-07-20 DIAGNOSIS — Z6824 Body mass index (BMI) 24.0-24.9, adult: Secondary | ICD-10-CM | POA: Diagnosis not present

## 2017-01-29 DIAGNOSIS — K219 Gastro-esophageal reflux disease without esophagitis: Secondary | ICD-10-CM | POA: Diagnosis not present

## 2017-01-29 DIAGNOSIS — N926 Irregular menstruation, unspecified: Secondary | ICD-10-CM | POA: Diagnosis not present

## 2017-01-29 DIAGNOSIS — K5900 Constipation, unspecified: Secondary | ICD-10-CM | POA: Diagnosis not present

## 2017-01-29 DIAGNOSIS — Z6824 Body mass index (BMI) 24.0-24.9, adult: Secondary | ICD-10-CM | POA: Diagnosis not present

## 2017-01-29 DIAGNOSIS — Z23 Encounter for immunization: Secondary | ICD-10-CM | POA: Diagnosis not present

## 2017-04-13 DIAGNOSIS — J069 Acute upper respiratory infection, unspecified: Secondary | ICD-10-CM | POA: Diagnosis not present

## 2017-04-13 DIAGNOSIS — J340 Abscess, furuncle and carbuncle of nose: Secondary | ICD-10-CM | POA: Diagnosis not present

## 2017-04-13 DIAGNOSIS — Z6823 Body mass index (BMI) 23.0-23.9, adult: Secondary | ICD-10-CM | POA: Diagnosis not present

## 2017-04-20 DIAGNOSIS — L0292 Furuncle, unspecified: Secondary | ICD-10-CM | POA: Diagnosis not present

## 2017-04-20 DIAGNOSIS — Z6823 Body mass index (BMI) 23.0-23.9, adult: Secondary | ICD-10-CM | POA: Diagnosis not present
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
## 2017-04-30 ENCOUNTER — Encounter: Payer: Self-pay | Admitting: Obstetrics and Gynecology

## 2017-04-30 ENCOUNTER — Ambulatory Visit (INDEPENDENT_AMBULATORY_CARE_PROVIDER_SITE_OTHER): Payer: BLUE CROSS/BLUE SHIELD | Admitting: Obstetrics and Gynecology

## 2017-04-30 ENCOUNTER — Other Ambulatory Visit: Payer: Self-pay

## 2017-04-30 ENCOUNTER — Other Ambulatory Visit (HOSPITAL_COMMUNITY)
Admission: RE | Admit: 2017-04-30 | Discharge: 2017-04-30 | Disposition: A | Discharge status: Home / Self Care | Payer: BLUE CROSS/BLUE SHIELD | Source: Ambulatory Visit | Attending: Obstetrics and Gynecology | Admitting: Obstetrics and Gynecology

## 2017-04-30 VITALS — BP 120/74 | HR 80 | Resp 16 | Ht 65.5 in | Wt 141.0 lb

## 2017-04-30 DIAGNOSIS — Z01419 Encounter for gynecological examination (general) (routine) without abnormal findings: Secondary | ICD-10-CM

## 2017-04-30 NOTE — Patient Instructions (Signed)

## 2017-05-01 LAB — TSH: TSH: 1.68 u[IU]/mL (ref 0.450–4.500)

## 2017-05-04 LAB — CYTOLOGY - PAP
Diagnosis: NEGATIVE
HPV: NOT DETECTED

## 2017-07-13 ENCOUNTER — Encounter: Payer: Self-pay | Admitting: Obstetrics and Gynecology

## 2017-07-13 ENCOUNTER — Telehealth: Payer: Self-pay | Admitting: Obstetrics and Gynecology

## 2017-07-13 NOTE — Telephone Encounter (Signed)
I can refill the nortriptyline 10 mg q hs with refills until her annual exam is due.  If she has recurrent pain issues, we will have her return to her pain management provider.

## 2017-07-13 NOTE — Telephone Encounter (Signed)
Spoke with patient and advised not sure if Dr.Silva will Rx this medication or not. She states she takes 10mg  at bedtime and is no longer taking the hydrocodone. Advised will send message to Dr.Silva and give her a call back.Pharmacy is as stated on file. Routed to Dr.Silva

## 2017-07-13 NOTE — Telephone Encounter (Signed)
Patient sent the following correspondence through MyChart. Routing to triage to assist patient with request.  ----- Message from Mychart, Generic sent at 07/13/2017 1:58 PM EST -----    Hello Dr.Silva,  I hope you are well. I wanted to see if you could send me in a prescription for nortriptyline? I have been on this medication for a couple years now and it has been prescribed through my pain management doctor. My main trouble was sleeping with aches and pains in my back and this medication has helped. I am not taking the hydrocodone (haven't been for a while) and no longer feel the need to see the pain management doctor. I was under the impression they would still refill my prescription for nortriptyline but when I tried to refill they said it had been too long since I had seen them. I know we briefly talked about this medicine at my last visit so was hoping you could send in a refill.  Thanks,   Stacey Foster

## 2017-07-14 MED ORDER — NORTRIPTYLINE HCL 10 MG PO CAPS: 10.0000 mg | ORAL_CAPSULE | Freq: Every day | ORAL | 3 refills | Status: DC

## 2017-07-14 NOTE — Telephone Encounter (Signed)
Spoke with patient, advised as seen below per Dr. Edward JollySilva. Patient request 90 day supply, Rx sent to verified pharmacy. Next AEX 05/13/18. Patient verbalizes understanding and is agreeable.  Routing to provider for final review. Patient is agreeable to disposition. Will close encounter.

## 2017-08-04 DIAGNOSIS — Z13220 Encounter for screening for lipoid disorders: Secondary | ICD-10-CM | POA: Diagnosis not present

## 2017-08-04 DIAGNOSIS — Z1329 Encounter for screening for other suspected endocrine disorder: Secondary | ICD-10-CM | POA: Diagnosis not present

## 2017-08-04 DIAGNOSIS — Z0000 Encounter for general adult medical examination without abnormal findings: Secondary | ICD-10-CM | POA: Diagnosis not present

## 2017-08-04 DIAGNOSIS — Z114 Encounter for screening for human immunodeficiency virus [HIV]: Secondary | ICD-10-CM | POA: Diagnosis not present

## 2017-08-09 ENCOUNTER — Other Ambulatory Visit: Payer: Self-pay | Admitting: Family Medicine

## 2017-08-09 DIAGNOSIS — Z0000 Encounter for general adult medical examination without abnormal findings: Secondary | ICD-10-CM | POA: Diagnosis not present

## 2017-08-09 DIAGNOSIS — J309 Allergic rhinitis, unspecified: Secondary | ICD-10-CM | POA: Diagnosis not present

## 2017-08-09 DIAGNOSIS — Z6823 Body mass index (BMI) 23.0-23.9, adult: Secondary | ICD-10-CM | POA: Diagnosis not present

## 2017-08-09 DIAGNOSIS — Z1231 Encounter for screening mammogram for malignant neoplasm of breast: Secondary | ICD-10-CM

## 2017-08-11 DIAGNOSIS — D485 Neoplasm of uncertain behavior of skin: Secondary | ICD-10-CM | POA: Diagnosis not present

## 2017-08-11 DIAGNOSIS — L814 Other melanin hyperpigmentation: Secondary | ICD-10-CM | POA: Diagnosis not present

## 2017-08-11 DIAGNOSIS — D1801 Hemangioma of skin and subcutaneous tissue: Secondary | ICD-10-CM | POA: Diagnosis not present

## 2017-08-11 DIAGNOSIS — D225 Melanocytic nevi of trunk: Secondary | ICD-10-CM | POA: Diagnosis not present

## 2017-08-11 DIAGNOSIS — Z86018 Personal history of other benign neoplasm: Secondary | ICD-10-CM | POA: Diagnosis not present

## 2017-11-08 ENCOUNTER — Ambulatory Visit
Admission: RE | Admit: 2017-11-08 | Discharge: 2017-11-08 | Disposition: A | Discharge status: Home / Self Care | Payer: BLUE CROSS/BLUE SHIELD | Source: Ambulatory Visit | Attending: Family Medicine | Admitting: Family Medicine

## 2017-11-08 DIAGNOSIS — Z1231 Encounter for screening mammogram for malignant neoplasm of breast: Secondary | ICD-10-CM

## 2017-11-10 ENCOUNTER — Other Ambulatory Visit: Payer: Self-pay | Admitting: Family Medicine

## 2017-11-10 DIAGNOSIS — R928 Other abnormal and inconclusive findings on diagnostic imaging of breast: Secondary | ICD-10-CM

## 2017-11-12 ENCOUNTER — Ambulatory Visit
Admission: RE | Admit: 2017-11-12 | Discharge: 2017-11-12 | Disposition: A | Discharge status: Home / Self Care | Payer: BLUE CROSS/BLUE SHIELD | Source: Ambulatory Visit | Attending: Family Medicine | Admitting: Family Medicine

## 2017-11-12 DIAGNOSIS — R922 Inconclusive mammogram: Secondary | ICD-10-CM | POA: Diagnosis not present

## 2017-11-12 DIAGNOSIS — R928 Other abnormal and inconclusive findings on diagnostic imaging of breast: Secondary | ICD-10-CM

## 2017-11-12 DIAGNOSIS — N6001 Solitary cyst of right breast: Secondary | ICD-10-CM | POA: Diagnosis not present

## 2018-03-09 DIAGNOSIS — Z23 Encounter for immunization: Secondary | ICD-10-CM | POA: Diagnosis not present

## 2018-03-18 DIAGNOSIS — H5213 Myopia, bilateral: Secondary | ICD-10-CM | POA: Diagnosis not present

## 2018-04-04 ENCOUNTER — Other Ambulatory Visit: Payer: Self-pay | Admitting: Obstetrics and Gynecology

## 2018-04-12 DIAGNOSIS — G4700 Insomnia, unspecified: Secondary | ICD-10-CM | POA: Diagnosis not present

## 2018-04-12 DIAGNOSIS — Z111 Encounter for screening for respiratory tuberculosis: Secondary | ICD-10-CM | POA: Diagnosis not present

## 2018-04-12 DIAGNOSIS — Z23 Encounter for immunization: Secondary | ICD-10-CM | POA: Diagnosis not present

## 2018-04-12 DIAGNOSIS — K219 Gastro-esophageal reflux disease without esophagitis: Secondary | ICD-10-CM | POA: Diagnosis not present

## 2018-04-12 DIAGNOSIS — J309 Allergic rhinitis, unspecified: Secondary | ICD-10-CM | POA: Diagnosis not present

## 2018-05-09 DIAGNOSIS — Z23 Encounter for immunization: Secondary | ICD-10-CM | POA: Diagnosis not present
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
## 2018-05-13 ENCOUNTER — Encounter: Payer: Self-pay | Admitting: Obstetrics and Gynecology

## 2018-05-13 ENCOUNTER — Other Ambulatory Visit: Payer: Self-pay

## 2018-05-13 ENCOUNTER — Ambulatory Visit: Payer: BLUE CROSS/BLUE SHIELD | Admitting: Obstetrics and Gynecology

## 2018-05-13 VITALS — BP 124/62 | HR 76 | Resp 16 | Ht 65.0 in | Wt 152.0 lb

## 2018-05-13 DIAGNOSIS — Z01419 Encounter for gynecological examination (general) (routine) without abnormal findings: Secondary | ICD-10-CM | POA: Diagnosis not present

## 2018-05-13 DIAGNOSIS — N644 Mastodynia: Secondary | ICD-10-CM

## 2018-05-13 NOTE — Patient Instructions (Signed)
Consider [facility] for acupuncture therapy.    EXERCISE AND DIET:  We recommended that you start or continue a regular exercise program for good health. Regular exercise means any activity that makes your heart beat faster and makes you sweat.  We recommend exercising at least 30 minutes per day at least 3 days a week, preferably 4 or 5.  We also recommend a diet low in fat and sugar.  Inactivity, poor dietary choices and obesity can cause diabetes, heart attack, stroke, and kidney damage, among others.    ALCOHOL AND SMOKING:  Women should limit their alcohol intake to no more than 7 drinks/beers/glasses of wine (combined, not each!) per week. Moderation of alcohol intake to this level decreases your risk of breast cancer and liver damage. And of course, no recreational drugs are part of a healthy lifestyle.  And absolutely no smoking or even second hand smoke. Most people know smoking can cause heart and lung diseases, but did you know it also contributes to weakening of your bones? Aging of your skin?  Yellowing of your teeth and nails?  CALCIUM AND VITAMIN D:  Adequate intake of calcium and Vitamin D are recommended.  The recommendations for exact amounts of these supplements seem to change often, but generally speaking 600 mg of calcium (either carbonate or citrate) and 800 units of Vitamin D per day seems prudent. Certain women may benefit from higher intake of Vitamin D.  If you are among these women, your doctor will have told you during your visit.    PAP SMEARS:  Pap smears, to check for cervical cancer or precancers,  have traditionally been done yearly, although recent scientific advances have shown that most women can have pap smears less often.  However, every woman still should have a physical exam from her gynecologist every year. It will include a breast check, inspection of the vulva and vagina to check for abnormal growths or skin changes, a visual exam of the cervix, and then  an exam to evaluate the size and shape of the uterus and ovaries.  And after 40 years of age, a rectal exam is indicated to check for rectal cancers. We will also provide age appropriate advice regarding health maintenance, like when you should have certain vaccines, screening for sexually transmitted diseases, bone density testing, colonoscopy, mammograms, etc.   MAMMOGRAMS:  All women over 40 years old should have a yearly mammogram. Many facilities now offer a "3D" mammogram, which may cost around $50 extra out of pocket. If possible,  we recommend you accept the option to have the 3D mammogram performed.  It both reduces the number of women who will be called back for extra views which then turn out to be normal, and it is better than the routine mammogram at detecting truly abnormal areas.    COLONOSCOPY:  Colonoscopy to screen for colon cancer is recommended for all women at age 40.  We know, you hate the idea of the prep.  We agree, BUT, having colon cancer and not knowing it is worse!!  Colon cancer so often starts as a polyp that can be seen and removed at colonscopy, which can quite literally save your life!  And if your first colonoscopy is normal and you have no family history of colon cancer, most women don't have to have it again for 10 years.  Once every ten years, you can do something that may end up saving your life, right?  We will be happy to  help you get it scheduled when you are ready.  Be sure to check your insurance coverage so you understand how much it will cost.  It may be covered as a preventative service at no cost, but you should check your particular policy.

## 2018-08-10 DIAGNOSIS — D224 Melanocytic nevi of scalp and neck: Secondary | ICD-10-CM | POA: Diagnosis not present

## 2018-08-10 DIAGNOSIS — Z86018 Personal history of other benign neoplasm: Secondary | ICD-10-CM | POA: Diagnosis not present

## 2018-08-10 DIAGNOSIS — L814 Other melanin hyperpigmentation: Secondary | ICD-10-CM | POA: Diagnosis not present

## 2018-08-10 DIAGNOSIS — D225 Melanocytic nevi of trunk: Secondary | ICD-10-CM | POA: Diagnosis not present

## 2018-09-14 DIAGNOSIS — K219 Gastro-esophageal reflux disease without esophagitis: Secondary | ICD-10-CM | POA: Diagnosis not present

## 2018-09-14 DIAGNOSIS — Z719 Counseling, unspecified: Secondary | ICD-10-CM | POA: Diagnosis not present

## 2018-09-14 DIAGNOSIS — W57XXXA Bitten or stung by nonvenomous insect and other nonvenomous arthropods, initial encounter: Secondary | ICD-10-CM | POA: Diagnosis not present

## 2018-10-19 ENCOUNTER — Other Ambulatory Visit: Payer: Self-pay | Admitting: Obstetrics and Gynecology

## 2018-10-19 DIAGNOSIS — Z1231 Encounter for screening mammogram for malignant neoplasm of breast: Secondary | ICD-10-CM

## 2018-12-05 ENCOUNTER — Other Ambulatory Visit: Payer: Self-pay

## 2018-12-05 ENCOUNTER — Ambulatory Visit
Admission: RE | Admit: 2018-12-05 | Discharge: 2018-12-05 | Disposition: A | Discharge status: Home / Self Care | Payer: BC Managed Care – PPO | Source: Ambulatory Visit | Attending: Obstetrics and Gynecology | Admitting: Obstetrics and Gynecology

## 2018-12-05 DIAGNOSIS — Z1231 Encounter for screening mammogram for malignant neoplasm of breast: Secondary | ICD-10-CM | POA: Diagnosis not present

## 2018-12-14 DIAGNOSIS — Z0000 Encounter for general adult medical examination without abnormal findings: Secondary | ICD-10-CM | POA: Diagnosis not present

## 2018-12-20 DIAGNOSIS — Z6824 Body mass index (BMI) 24.0-24.9, adult: Secondary | ICD-10-CM | POA: Diagnosis not present

## 2018-12-20 DIAGNOSIS — Z0000 Encounter for general adult medical examination without abnormal findings: Secondary | ICD-10-CM | POA: Diagnosis not present

## 2019-03-14 DIAGNOSIS — Z23 Encounter for immunization: Secondary | ICD-10-CM | POA: Diagnosis not present

## 2019-05-24 ENCOUNTER — Telehealth: Payer: Self-pay | Admitting: Obstetrics and Gynecology

## 2019-05-24 NOTE — Telephone Encounter (Signed)
Left message on voicemail to call and reschedule cancelled appointment. °

## 2019-05-29 DIAGNOSIS — B029 Zoster without complications: Secondary | ICD-10-CM

## 2019-05-29 DIAGNOSIS — Z1159 Encounter for screening for other viral diseases: Secondary | ICD-10-CM | POA: Diagnosis not present

## 2019-05-29 DIAGNOSIS — J22 Unspecified acute lower respiratory infection: Secondary | ICD-10-CM | POA: Diagnosis not present

## 2019-05-29 HISTORY — DX: Zoster without complications: B02.9

## 2019-05-31 ENCOUNTER — Telehealth: Payer: Self-pay | Admitting: Obstetrics and Gynecology

## 2019-05-31 DIAGNOSIS — Z01419 Encounter for gynecological examination (general) (routine) without abnormal findings: Secondary | ICD-10-CM

## 2019-05-31 NOTE — Telephone Encounter (Signed)
Patient should wait until her lesions have crusted before having an appointment.  I would think that she should be fine to come in the beginning of February.   We can precert her Mirena IUD exchange, but it will need to be done a day that is different from her annual exam. I would complete the annual exam first.

## 2019-05-31 NOTE — Telephone Encounter (Signed)
Patient returned a call to Stephanie. 

## 2019-05-31 NOTE — Telephone Encounter (Signed)
Spoke to pt. Pt states wants to know what to do with BTB that started x 6 months ago and having period like sx including breast pain, spotting x 2 days then goes away. Pt has IUD that is due for removal on 02/2020. Wants to discuss about BTB and possible IUD exchange early?  Pt also states being dx with shingles on her hip 05/29/2019. Has AEX on 06/07/2019. Pt wants to know what to do about appt for AEX and when she should come in after shingles dx. Advised pt to reschedule AEX. Pt agreeable. Will speak to Dr Edward Jolly for recommendations on when to return and talk about IUD and AEX then return call to pt. Pt agreeable.   Will route to Dr Edward Jolly.

## 2019-05-31 NOTE — Telephone Encounter (Signed)
Left message for pt to call back to Monicia Tse, RN in triage. 

## 2019-05-31 NOTE — Telephone Encounter (Signed)
Patient would like to change her IUD early?

## 2019-05-31 NOTE — Telephone Encounter (Signed)
Spoke to pt. Pt aware of needing to reschedule AEX to Feb. AEX scheduled for 07/12/2019 at 1000. Pt also scheduled Mirena IUD exchange on 07/19/2019 at 1000. Pt agreeable and verbalized  understanding and knows about taking Motrin 800 mg with food and water one hour before procedure. Pt aware of benefits call for IUD.   Will route to Dr Edward Jolly for review and will close encounter.   Cc: Zachery Dauer for precert. Orders placed.

## 2019-06-02 ENCOUNTER — Ambulatory Visit: Payer: BLUE CROSS/BLUE SHIELD | Admitting: Obstetrics and Gynecology

## 2019-06-07 ENCOUNTER — Ambulatory Visit: Payer: BLUE CROSS/BLUE SHIELD | Admitting: Obstetrics and Gynecology

## 2019-06-07 ENCOUNTER — Telehealth: Payer: Self-pay | Admitting: Obstetrics and Gynecology

## 2019-06-07 NOTE — Telephone Encounter (Signed)
Call placed to convey benefits for Mirena exchange. 

## 2019-06-16 NOTE — Telephone Encounter (Signed)
Patient retyrned my call and I conveyed the benefits. Patient understands/agreeable with the benefits. Patient is aware of the cancellation policy. Appointment scheduled 07/19/19. Patient requested to reschedule her annual appointment. Appointment rescheduled to 06/27/19 with Dr. Edward Jolly.

## 2019-06-19 DIAGNOSIS — B349 Viral infection, unspecified: Secondary | ICD-10-CM | POA: Diagnosis not present

## 2019-06-19 DIAGNOSIS — B029 Zoster without complications: Secondary | ICD-10-CM | POA: Diagnosis not present

## 2019-06-19 DIAGNOSIS — R21 Rash and other nonspecific skin eruption: Secondary | ICD-10-CM | POA: Diagnosis not present

## 2019-06-26 ENCOUNTER — Other Ambulatory Visit: Payer: Self-pay
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
## 2019-06-27 ENCOUNTER — Ambulatory Visit: Payer: BC Managed Care – PPO | Admitting: Obstetrics and Gynecology

## 2019-06-27 ENCOUNTER — Encounter: Payer: Self-pay | Admitting: Obstetrics and Gynecology

## 2019-06-27 VITALS — BP 120/78 | HR 75 | Temp 98.4°F | Ht 65.5 in | Wt 164.8 lb

## 2019-06-27 DIAGNOSIS — Z01419 Encounter for gynecological examination (general) (routine) without abnormal findings: Secondary | ICD-10-CM

## 2019-06-27 NOTE — Patient Instructions (Signed)

## 2019-06-28 LAB — COMPREHENSIVE METABOLIC PANEL
ALT: 13 IU/L (ref 0–32)
AST: 14 IU/L (ref 0–40)
Albumin/Globulin Ratio: 1.8 (ref 1.2–2.2)
Albumin: 4.6 g/dL (ref 3.8–4.8)
Alkaline Phosphatase: 66 IU/L (ref 39–117)
BUN/Creatinine Ratio: 17 (ref 9–23)
BUN: 12 mg/dL (ref 6–24)
Bilirubin Total: 0.8 mg/dL (ref 0.0–1.2)
CO2: 22 mmol/L (ref 20–29)
Calcium: 9.4 mg/dL (ref 8.7–10.2)
Chloride: 105 mmol/L (ref 96–106)
Creatinine, Ser: 0.72 mg/dL (ref 0.57–1.00)
GFR calc Af Amer: 120 mL/min/{1.73_m2} (ref >59)
GFR calc non Af Amer: 104 mL/min/{1.73_m2} (ref >59)
Globulin, Total: 2.6 g/dL (ref 1.5–4.5)
Glucose: 78 mg/dL (ref 65–99)
Potassium: 4.6 mmol/L (ref 3.5–5.2)
Sodium: 140 mmol/L (ref 134–144)
Total Protein: 7.2 g/dL (ref 6.0–8.5)

## 2019-06-28 LAB — CBC WITH DIFFERENTIAL/PLATELET
Basophils Absolute: 0.1 10*3/uL (ref 0.0–0.2)
Basos: 1 %
EOS (ABSOLUTE): 0.2 10*3/uL (ref 0.0–0.4)
Eos: 3 %
Hematocrit: 40.3 % (ref 34.0–46.6)
Hemoglobin: 13.5 g/dL (ref 11.1–15.9)
Immature Grans (Abs): 0 10*3/uL (ref 0.0–0.1)
Immature Granulocytes: 0 %
Lymphocytes Absolute: 1.7 10*3/uL (ref 0.7–3.1)
Lymphs: 26 %
MCH: 30 pg (ref 26.6–33.0)
MCHC: 33.5 g/dL (ref 31.5–35.7)
MCV: 90 fL (ref 79–97)
Monocytes Absolute: 0.6 10*3/uL (ref 0.1–0.9)
Monocytes: 9 %
Neutrophils Absolute: 4.1 10*3/uL (ref 1.4–7.0)
Neutrophils: 61 %
Platelets: 324 10*3/uL (ref 150–450)
RBC: 4.5 x10E6/uL (ref 3.77–5.28)
RDW: 12.1 % (ref 11.7–15.4)
WBC: 6.5 10*3/uL (ref 3.4–10.8)

## 2019-06-28 LAB — TSH: TSH: 1.86 u[IU]/mL (ref 0.450–4.500)

## 2019-07-12 ENCOUNTER — Ambulatory Visit: Payer: BLUE CROSS/BLUE SHIELD | Admitting: Obstetrics and Gynecology

## 2019-07-18 ENCOUNTER — Other Ambulatory Visit: Payer: Self-pay

## 2019-07-19 ENCOUNTER — Ambulatory Visit (INDEPENDENT_AMBULATORY_CARE_PROVIDER_SITE_OTHER): Payer: BC Managed Care – PPO | Admitting: Obstetrics and Gynecology

## 2019-07-19 ENCOUNTER — Encounter: Payer: Self-pay | Admitting: Obstetrics and Gynecology

## 2019-07-19 VITALS — BP 100/72 | HR 76 | Temp 97.1°F | Ht 65.5 in | Wt 166.6 lb

## 2019-07-19 DIAGNOSIS — Z30433 Encounter for removal and reinsertion of intrauterine contraceptive device: Secondary | ICD-10-CM

## 2019-07-19 NOTE — Patient Instructions (Signed)
Intrauterine Device Insertion, Care After  This sheet gives you information about how to care for yourself after your procedure. Your health care provider may also give you more specific instructions. If you have problems or questions, contact your health care provider. What can I expect after the procedure? After the procedure, it is common to have:  Cramps and pain in the abdomen.  Light bleeding (spotting) or heavier bleeding that is like your menstrual period. This may last for up to a few days.  Lower back pain.  Dizziness.  Headaches.  Nausea. Follow these instructions at home:  Before resuming sexual activity, check to make sure that you can feel the IUD string(s). You should be able to feel the end of the string(s) below the opening of your cervix. If your IUD string is in place, you may resume sexual activity. ? If you had a hormonal IUD inserted more than 7 days after your most recent period started, you will need to use a backup method of birth control for 7 days after IUD insertion. Ask your health care provider whether this applies to you.  Continue to check that the IUD is still in place by feeling for the string(s) after every menstrual period, or once a month.  Take over-the-counter and prescription medicines only as told by your health care provider.  Do not drive or use heavy machinery while taking prescription pain medicine.  Keep all follow-up visits as told by your health care provider. This is important. Contact a health care provider if:  You have bleeding that is heavier or lasts longer than a normal menstrual cycle.  You have a fever.  You have cramps or abdominal pain that get worse or do not get better with medicine.  You develop abdominal pain that is new or is not in the same area of earlier cramping and pain.  You feel lightheaded or weak.  You have abnormal or bad-smelling discharge from your vagina.  You have pain during sexual  activity.  You have any of the following problems with your IUD string(s): ? The string bothers or hurts you or your sexual partner. ? You cannot feel the string. ? The string has gotten longer.  You can feel the IUD in your vagina.  You think you may be pregnant, or you miss your menstrual period.  You think you may have an STI (sexually transmitted infection). Get help right away if:  You have flu-like symptoms.  You have a fever and chills.  You can feel that your IUD has slipped out of place. Summary  After the procedure, it is common to have cramps and pain in the abdomen. It is also common to have light bleeding (spotting) or heavier bleeding that is like your menstrual period.  Continue to check that the IUD is still in place by feeling for the string(s) after every menstrual period, or once a month.  Keep all follow-up visits as told by your health care provider. This is important.  Contact your health care provider if you have problems with your IUD string(s), such as the string getting longer or bothering you or your sexual partner. This information is not intended to replace advice given to you by your health care provider. Make sure you discuss any questions you have with your health care provider. Document Revised: 04/23/2017 Document Reviewed: 04/01/2016 Elsevier Patient Education  2020 Elsevier Inc.  
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
## 2019-08-01 DIAGNOSIS — F439 Reaction to severe stress, unspecified: Secondary | ICD-10-CM | POA: Diagnosis not present

## 2019-08-10 DIAGNOSIS — L814 Other melanin hyperpigmentation: Secondary | ICD-10-CM | POA: Diagnosis not present

## 2019-08-10 DIAGNOSIS — Z86018 Personal history of other benign neoplasm: Secondary | ICD-10-CM | POA: Diagnosis not present

## 2019-08-10 DIAGNOSIS — D224 Melanocytic nevi of scalp and neck: Secondary | ICD-10-CM | POA: Diagnosis not present

## 2019-08-10 DIAGNOSIS — D225 Melanocytic nevi of trunk: Secondary | ICD-10-CM | POA: Diagnosis not present
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
## 2019-08-16 ENCOUNTER — Ambulatory Visit: Payer: Self-pay | Admitting: Obstetrics and Gynecology

## 2019-08-16 ENCOUNTER — Other Ambulatory Visit: Payer: Self-pay

## 2019-08-16 ENCOUNTER — Encounter: Payer: Self-pay | Admitting: Obstetrics and Gynecology

## 2019-08-16 VITALS — BP 122/82 | HR 76 | Temp 98.9°F | Ht 65.5 in | Wt 166.8 lb

## 2019-08-16 DIAGNOSIS — Z30431 Encounter for routine checking of intrauterine contraceptive device: Secondary | ICD-10-CM | POA: Diagnosis not present

## 2019-08-16 DIAGNOSIS — R635 Abnormal weight gain: Secondary | ICD-10-CM | POA: Diagnosis not present

## 2019-10-24 ENCOUNTER — Other Ambulatory Visit: Payer: Self-pay | Admitting: Obstetrics and Gynecology

## 2019-10-24 DIAGNOSIS — Z1231 Encounter for screening mammogram for malignant neoplasm of breast: Secondary | ICD-10-CM

## 2019-11-14 ENCOUNTER — Other Ambulatory Visit: Payer: Self-pay | Admitting: Nurse Practitioner

## 2019-11-14 DIAGNOSIS — R519 Headache, unspecified: Secondary | ICD-10-CM

## 2019-11-14 DIAGNOSIS — S060X0A Concussion without loss of consciousness, initial encounter: Secondary | ICD-10-CM | POA: Diagnosis not present

## 2019-11-16 DIAGNOSIS — G44309 Post-traumatic headache, unspecified, not intractable: Secondary | ICD-10-CM | POA: Diagnosis not present

## 2019-11-16 DIAGNOSIS — S060X0A Concussion without loss of consciousness, initial encounter: Secondary | ICD-10-CM | POA: Diagnosis not present

## 2019-11-23 ENCOUNTER — Other Ambulatory Visit: Payer: BC Managed Care – PPO

## 2019-11-29 DIAGNOSIS — Z130 Encounter for screening for diseases of the blood and blood-forming organs and certain disorders involving the immune mechanism: Secondary | ICD-10-CM | POA: Diagnosis not present

## 2019-11-29 DIAGNOSIS — Z13220 Encounter for screening for lipoid disorders: Secondary | ICD-10-CM | POA: Diagnosis not present

## 2019-11-29 DIAGNOSIS — Z0000 Encounter for general adult medical examination without abnormal findings: Secondary | ICD-10-CM | POA: Diagnosis not present

## 2019-11-29 DIAGNOSIS — F439 Reaction to severe stress, unspecified: Secondary | ICD-10-CM | POA: Diagnosis not present

## 2019-11-29 DIAGNOSIS — Z6826 Body mass index (BMI) 26.0-26.9, adult: Secondary | ICD-10-CM | POA: Diagnosis not present

## 2019-12-12 ENCOUNTER — Other Ambulatory Visit: Payer: Self-pay

## 2019-12-12 ENCOUNTER — Ambulatory Visit
Admission: RE | Admit: 2019-12-12 | Discharge: 2019-12-12 | Disposition: A | Discharge status: Home / Self Care | Payer: BC Managed Care – PPO | Source: Ambulatory Visit | Attending: Obstetrics and Gynecology | Admitting: Obstetrics and Gynecology

## 2019-12-12 DIAGNOSIS — Z1231 Encounter for screening mammogram for malignant neoplasm of breast: Secondary | ICD-10-CM | POA: Diagnosis not present

## 2020-01-23 DIAGNOSIS — F419 Anxiety disorder, unspecified: Secondary | ICD-10-CM | POA: Diagnosis not present

## 2020-01-23 DIAGNOSIS — F439 Reaction to severe stress, unspecified: Secondary | ICD-10-CM | POA: Diagnosis not present

## 2020-02-28 DIAGNOSIS — B078 Other viral warts: Secondary | ICD-10-CM | POA: Diagnosis not present

## 2020-03-01 DIAGNOSIS — H5213 Myopia, bilateral: Secondary | ICD-10-CM | POA: Diagnosis not present

## 2020-03-19 DIAGNOSIS — B078 Other viral warts: Secondary | ICD-10-CM | POA: Diagnosis not present

## 2020-07-24 ENCOUNTER — Ambulatory Visit: Payer: BC Managed Care – PPO | Admitting: Obstetrics and Gynecology

## 2020-07-30 ENCOUNTER — Other Ambulatory Visit (HOSPITAL_COMMUNITY)
Admission: RE | Admit: 2020-07-30 | Discharge: 2020-07-30 | Disposition: A | Discharge status: Home / Self Care | Payer: BC Managed Care – PPO | Source: Ambulatory Visit | Attending: Orthopedic Surgery | Admitting: Orthopedic Surgery

## 2020-07-30 ENCOUNTER — Other Ambulatory Visit: Payer: Self-pay

## 2020-07-30 ENCOUNTER — Encounter (HOSPITAL_BASED_OUTPATIENT_CLINIC_OR_DEPARTMENT_OTHER): Payer: Self-pay | Admitting: Orthopedic Surgery

## 2020-07-30 ENCOUNTER — Other Ambulatory Visit (HOSPITAL_COMMUNITY): Payer: Self-pay | Admitting: Orthopedic Surgery

## 2020-07-30 DIAGNOSIS — S92321A Displaced fracture of second metatarsal bone, right foot, initial encounter for closed fracture: Secondary | ICD-10-CM | POA: Diagnosis not present

## 2020-07-30 DIAGNOSIS — Z01812 Encounter for preprocedural laboratory examination: Secondary | ICD-10-CM | POA: Insufficient documentation

## 2020-07-30 DIAGNOSIS — Z793 Long term (current) use of hormonal contraceptives: Secondary | ICD-10-CM | POA: Diagnosis not present

## 2020-07-30 DIAGNOSIS — X501XXA Overexertion from prolonged static or awkward postures, initial encounter: Secondary | ICD-10-CM | POA: Diagnosis not present

## 2020-07-30 DIAGNOSIS — Z79899 Other long term (current) drug therapy: Secondary | ICD-10-CM | POA: Diagnosis not present

## 2020-07-30 DIAGNOSIS — S93324A Dislocation of tarsometatarsal joint of right foot, initial encounter: Secondary | ICD-10-CM | POA: Diagnosis present

## 2020-07-30 DIAGNOSIS — Z20822 Contact with and (suspected) exposure to covid-19: Secondary | ICD-10-CM | POA: Insufficient documentation

## 2020-07-30 LAB — SARS CORONAVIRUS 2 (TAT 6-24 HRS): SARS Coronavirus 2: NEGATIVE

## 2020-08-01 ENCOUNTER — Ambulatory Visit (HOSPITAL_BASED_OUTPATIENT_CLINIC_OR_DEPARTMENT_OTHER)
Admission: RE | Admit: 2020-08-01 | Discharge: 2020-08-01 | Disposition: A | Discharge status: Home / Self Care | Payer: BC Managed Care – PPO | Attending: Orthopedic Surgery | Admitting: Orthopedic Surgery

## 2020-08-01 ENCOUNTER — Encounter (HOSPITAL_BASED_OUTPATIENT_CLINIC_OR_DEPARTMENT_OTHER)
Admission: RE | Disposition: A | Discharge status: Home / Self Care | Payer: Self-pay | Source: Home / Self Care | Attending: Orthopedic Surgery

## 2020-08-01 ENCOUNTER — Other Ambulatory Visit: Payer: Self-pay

## 2020-08-01 ENCOUNTER — Ambulatory Visit (HOSPITAL_BASED_OUTPATIENT_CLINIC_OR_DEPARTMENT_OTHER): Payer: BC Managed Care – PPO | Admitting: Anesthesiology

## 2020-08-01 ENCOUNTER — Encounter (HOSPITAL_BASED_OUTPATIENT_CLINIC_OR_DEPARTMENT_OTHER): Payer: Self-pay | Admitting: Orthopedic Surgery

## 2020-08-01 DIAGNOSIS — Z793 Long term (current) use of hormonal contraceptives: Secondary | ICD-10-CM | POA: Insufficient documentation

## 2020-08-01 DIAGNOSIS — Z20822 Contact with and (suspected) exposure to covid-19: Secondary | ICD-10-CM | POA: Insufficient documentation

## 2020-08-01 DIAGNOSIS — S93324A Dislocation of tarsometatarsal joint of right foot, initial encounter: Secondary | ICD-10-CM | POA: Insufficient documentation

## 2020-08-01 DIAGNOSIS — S92321A Displaced fracture of second metatarsal bone, right foot, initial encounter for closed fracture: Secondary | ICD-10-CM | POA: Insufficient documentation

## 2020-08-01 DIAGNOSIS — Z79899 Other long term (current) drug therapy: Secondary | ICD-10-CM | POA: Insufficient documentation

## 2020-08-01 DIAGNOSIS — X501XXA Overexertion from prolonged static or awkward postures, initial encounter: Secondary | ICD-10-CM | POA: Insufficient documentation

## 2020-08-01 HISTORY — DX: Gastro-esophageal reflux disease without esophagitis: K21.9

## 2020-08-01 HISTORY — PX: OPEN REDUCTION INTERNAL FIXATION (ORIF) FOOT LISFRANC FRACTURE: SHX5990

## 2020-08-01 LAB — POCT PREGNANCY, URINE: Preg Test, Ur: NEGATIVE

## 2020-08-01 SURGERY — OPEN REDUCTION INTERNAL FIXATION (ORIF) FOOT LISFRANC FRACTURE
Anesthesia: General | Site: Foot | Laterality: Right

## 2020-08-01 MED ORDER — BUPIVACAINE-EPINEPHRINE (PF) 0.5% -1:200000 IJ SOLN
INTRAMUSCULAR | Status: DC | PRN
  Administered 2020-08-01 (×2): 10 mL via PERINEURAL

## 2020-08-01 MED ORDER — ACETAMINOPHEN 325 MG PO TABS: 325.0000 mg | ORAL_TABLET | ORAL | Status: DC | PRN

## 2020-08-01 MED ORDER — SODIUM CHLORIDE 0.9 % IV SOLN: INTRAVENOUS | Status: DC

## 2020-08-01 MED ORDER — LACTATED RINGERS IV SOLN: INTRAVENOUS | Status: DC

## 2020-08-01 MED ORDER — OXYCODONE HCL 5 MG PO TABS: 5.0000 mg | ORAL_TABLET | Freq: Once | ORAL | Status: DC | PRN

## 2020-08-01 MED ORDER — LIDOCAINE 2% (20 MG/ML) 5 ML SYRINGE
INTRAMUSCULAR | Status: DC | PRN
  Administered 2020-08-01: 30 mg via INTRAVENOUS

## 2020-08-01 MED ORDER — MEPERIDINE HCL 25 MG/ML IJ SOLN
INTRAMUSCULAR | Status: AC
  Filled 2020-08-01: qty 1

## 2020-08-01 MED ORDER — FENTANYL CITRATE (PF) 100 MCG/2ML IJ SOLN
INTRAMUSCULAR | Status: AC
  Filled 2020-08-01: qty 2

## 2020-08-01 MED ORDER — MEPERIDINE HCL 25 MG/ML IJ SOLN
6.2500 mg | INTRAMUSCULAR | Status: DC | PRN
  Administered 2020-08-01: 6.25 mg via INTRAVENOUS

## 2020-08-01 MED ORDER — DEXAMETHASONE SODIUM PHOSPHATE 10 MG/ML IJ SOLN
INTRAMUSCULAR | Status: DC | PRN
  Administered 2020-08-01: 4 mg via INTRAVENOUS

## 2020-08-01 MED ORDER — ONDANSETRON HCL 4 MG/2ML IJ SOLN
INTRAMUSCULAR | Status: DC | PRN
  Administered 2020-08-01: 4 mg via INTRAVENOUS

## 2020-08-01 MED ORDER — OXYCODONE HCL 5 MG PO TABS: 5.0000 mg | ORAL_TABLET | Freq: Four times a day (QID) | ORAL | 0 refills | Status: AC | PRN

## 2020-08-01 MED ORDER — VANCOMYCIN HCL 500 MG IV SOLR
INTRAVENOUS | Status: DC | PRN
  Administered 2020-08-01: 500 mg

## 2020-08-01 MED ORDER — ONDANSETRON HCL 4 MG/2ML IJ SOLN: 4.0000 mg | Freq: Once | INTRAMUSCULAR | Status: DC | PRN

## 2020-08-01 MED ORDER — MIDAZOLAM HCL 2 MG/2ML IJ SOLN
INTRAMUSCULAR | Status: AC
  Filled 2020-08-01: qty 2

## 2020-08-01 MED ORDER — BUPIVACAINE LIPOSOME 1.3 % IJ SUSP
INTRAMUSCULAR | Status: DC | PRN
  Administered 2020-08-01 (×2): 10 mL via PERINEURAL

## 2020-08-01 MED ORDER — FENTANYL CITRATE (PF) 100 MCG/2ML IJ SOLN: 25.0000 ug | INTRAMUSCULAR | Status: DC | PRN

## 2020-08-01 MED ORDER — ACETAMINOPHEN 160 MG/5ML PO SOLN: 325.0000 mg | ORAL | Status: DC | PRN

## 2020-08-01 MED ORDER — PROPOFOL 10 MG/ML IV BOLUS
INTRAVENOUS | Status: DC | PRN
  Administered 2020-08-01: 200 mg via INTRAVENOUS

## 2020-08-01 MED ORDER — CEFAZOLIN SODIUM-DEXTROSE 2-4 GM/100ML-% IV SOLN
2.0000 g | INTRAVENOUS | Status: AC
  Administered 2020-08-01: 2 g via INTRAVENOUS

## 2020-08-01 MED ORDER — FENTANYL CITRATE (PF) 100 MCG/2ML IJ SOLN
100.0000 ug | Freq: Once | INTRAMUSCULAR | Status: AC
  Administered 2020-08-01: 100 ug via INTRAVENOUS

## 2020-08-01 MED ORDER — CEFAZOLIN SODIUM-DEXTROSE 2-4 GM/100ML-% IV SOLN
INTRAVENOUS | Status: AC
  Filled 2020-08-01: qty 100

## 2020-08-01 MED ORDER — OXYCODONE HCL 5 MG/5ML PO SOLN: 5.0000 mg | Freq: Once | ORAL | Status: DC | PRN

## 2020-08-01 MED ORDER — MIDAZOLAM HCL 2 MG/2ML IJ SOLN
2.0000 mg | Freq: Once | INTRAMUSCULAR | Status: AC
  Administered 2020-08-01: 2 mg via INTRAVENOUS

## 2020-08-01 SURGICAL SUPPLY — 77 items
APL PRP STRL LF DISP 70% ISPRP (MISCELLANEOUS) ×1
BANDAGE ESMARK 6X9 LF (GAUZE/BANDAGES/DRESSINGS) ×1 IMPLANT
BIT DRILL 2.9 CANN QC NONSTRL (BIT) ×2 IMPLANT
BIT DRILL CAL 2.5 ST W/SLV (BIT) ×2 IMPLANT
BLADE MICRO SAGITTAL (BLADE) IMPLANT
BLADE SURG 15 STRL LF DISP TIS (BLADE) ×2 IMPLANT
BLADE SURG 15 STRL SS (BLADE) ×4
BNDG CMPR 9X6 STRL LF SNTH (GAUZE/BANDAGES/DRESSINGS) ×1
BNDG COHESIVE 4X5 TAN STRL (GAUZE/BANDAGES/DRESSINGS) ×2 IMPLANT
BNDG COHESIVE 6X5 TAN STRL LF (GAUZE/BANDAGES/DRESSINGS) ×2 IMPLANT
BNDG ESMARK 6X9 LF (GAUZE/BANDAGES/DRESSINGS) ×2
CAP PIN PROTECTOR ORTHO WHT (CAP) IMPLANT
CHLORAPREP W/TINT 26 (MISCELLANEOUS) ×2 IMPLANT
COVER BACK TABLE 60X90IN (DRAPES) ×2 IMPLANT
COVER WAND RF STERILE (DRAPES) IMPLANT
CUFF TOURN SGL QUICK 34 (TOURNIQUET CUFF)
CUFF TRNQT CYL 34X4.125X (TOURNIQUET CUFF) IMPLANT
DECANTER SPIKE VIAL GLASS SM (MISCELLANEOUS) IMPLANT
DRAPE EXTREMITY T 121X128X90 (DISPOSABLE) ×2 IMPLANT
DRAPE OEC MINIVIEW 54X84 (DRAPES) ×2 IMPLANT
DRAPE U-SHAPE 47X51 STRL (DRAPES) ×2 IMPLANT
DRSG MEPITEL 4X7.2 (GAUZE/BANDAGES/DRESSINGS) ×2 IMPLANT
DRSG PAD ABDOMINAL 8X10 ST (GAUZE/BANDAGES/DRESSINGS) ×2 IMPLANT
ELECT REM PT RETURN 9FT ADLT (ELECTROSURGICAL) ×2
ELECTRODE REM PT RTRN 9FT ADLT (ELECTROSURGICAL) ×1 IMPLANT
GAUZE SPONGE 4X4 12PLY STRL (GAUZE/BANDAGES/DRESSINGS) ×2 IMPLANT
GLOVE SRG 8 PF TXTR STRL LF DI (GLOVE) ×2 IMPLANT
GLOVE SURG ENC MOIS LTX SZ8 (GLOVE) ×2 IMPLANT
GLOVE SURG LTX SZ8 (GLOVE) ×2 IMPLANT
GLOVE SURG POLYISO LF SZ7 (GLOVE) ×1 IMPLANT
GLOVE SURG UNDER POLY LF SZ7 (GLOVE) ×1 IMPLANT
GLOVE SURG UNDER POLY LF SZ8 (GLOVE) ×4
GOWN STRL REUS W/ TWL LRG LVL3 (GOWN DISPOSABLE) ×1 IMPLANT
GOWN STRL REUS W/ TWL XL LVL3 (GOWN DISPOSABLE) ×2 IMPLANT
GOWN STRL REUS W/TWL LRG LVL3 (GOWN DISPOSABLE) ×2
GOWN STRL REUS W/TWL XL LVL3 (GOWN DISPOSABLE) ×4
K-WIRE .054X4 (WIRE) IMPLANT
K-WIRE ACE 1.6X6 (WIRE) ×4
KIT STRATUM INSTRUMENT STD (KITS) ×2 IMPLANT
KWIRE ACE 1.6X6 (WIRE) ×2 IMPLANT
NDL HYPO 25X1 1.5 SAFETY (NEEDLE) IMPLANT
NEEDLE HYPO 22GX1.5 SAFETY (NEEDLE) IMPLANT
NEEDLE HYPO 25X1 1.5 SAFETY (NEEDLE) IMPLANT
PACK BASIN DAY SURGERY FS (CUSTOM PROCEDURE TRAY) ×2 IMPLANT
PAD CAST 4YDX4 CTTN HI CHSV (CAST SUPPLIES) ×1 IMPLANT
PADDING CAST ABS 4INX4YD NS (CAST SUPPLIES)
PADDING CAST ABS COTTON 4X4 ST (CAST SUPPLIES) IMPLANT
PADDING CAST COTTON 4X4 STRL (CAST SUPPLIES) ×2
PADDING CAST COTTON 6X4 STRL (CAST SUPPLIES) ×2 IMPLANT
PENCIL SMOKE EVACUATOR (MISCELLANEOUS) ×2 IMPLANT
PLATE SNGL LISFRANC 2H (Screw) ×1 IMPLANT
SANITIZER HAND PURELL 535ML FO (MISCELLANEOUS) ×2 IMPLANT
SCREW CANC FT 4X34 (Screw) ×1 IMPLANT
SCREW NL LP STRATUM 3.5X30 (Screw) ×2 IMPLANT
SCREW NLOCK CANC HEX 4X30 (Screw) ×2 IMPLANT
SCREW NLOCK T15 FT 30X4XST TIP (Screw) IMPLANT
SCREW NONLOCK ST 3.5X28 (Screw) ×1 IMPLANT
SCREW STRM NL LP 3.5X20 (Screw) ×1 IMPLANT
SHEET MEDIUM DRAPE 40X70 STRL (DRAPES) ×2 IMPLANT
SLEEVE SCD COMPRESS KNEE MED (STOCKING) ×2 IMPLANT
SPLINT FAST PLASTER 5X30 (CAST SUPPLIES) ×20
SPLINT PLASTER CAST FAST 5X30 (CAST SUPPLIES) ×20 IMPLANT
SPONGE LAP 18X18 RF (DISPOSABLE) ×2 IMPLANT
STOCKINETTE 6  STRL (DRAPES) ×2
STOCKINETTE 6 STRL (DRAPES) ×1 IMPLANT
SUCTION FRAZIER HANDLE 10FR (MISCELLANEOUS) ×2
SUCTION TUBE FRAZIER 10FR DISP (MISCELLANEOUS) ×1 IMPLANT
SUT ETHILON 3 0 PS 1 (SUTURE) ×2 IMPLANT
SUT MNCRL AB 3-0 PS2 18 (SUTURE) ×2 IMPLANT
SUT VIC AB 0 SH 27 (SUTURE) IMPLANT
SUT VIC AB 2-0 SH 27 (SUTURE) ×2
SUT VIC AB 2-0 SH 27XBRD (SUTURE) IMPLANT
SYR BULB EAR ULCER 3OZ GRN STR (SYRINGE) ×2 IMPLANT
SYR CONTROL 10ML LL (SYRINGE) IMPLANT
TOWEL GREEN STERILE FF (TOWEL DISPOSABLE) ×2 IMPLANT
TUBE CONNECTING 20X1/4 (TUBING) ×2 IMPLANT
UNDERPAD 30X36 HEAVY ABSORB (UNDERPADS AND DIAPERS) ×2 IMPLANT

## 2020-08-01 NOTE — Anesthesia Preprocedure Evaluation (Addendum)
Anesthesia Evaluation  Patient identified by MRN, date of birth, ID band Patient awake    Reviewed: Allergy & Precautions, H&P , NPO status , Patient's Chart, lab work & pertinent test results, reviewed documented beta blocker date and time   Airway Mallampati: I  TM Distance: >3 FB Neck ROM: full    Dental no notable dental hx. (+) Teeth Intact, Dental Advisory Given   Pulmonary neg pulmonary ROS,    Pulmonary exam normal breath sounds clear to auscultation       Cardiovascular Exercise Tolerance: Good negative cardio ROS   Rhythm:regular Rate:Normal     Neuro/Psych PSYCHIATRIC DISORDERS Anxiety Depression negative neurological ROS     GI/Hepatic Neg liver ROS, GERD  Medicated and Controlled,  Endo/Other  negative endocrine ROS  Renal/GU negative Renal ROS  negative genitourinary   Musculoskeletal   Abdominal   Peds  Hematology negative hematology ROS (+)   Anesthesia Other Findings   Reproductive/Obstetrics negative OB ROS                            Anesthesia Physical Anesthesia Plan  ASA: II  Anesthesia Plan: General   Post-op Pain Management: GA combined w/ Regional for post-op pain   Induction:   PONV Risk Score and Plan: 3 and Ondansetron, Midazolam and Dexamethasone  Airway Management Planned: Oral ETT and LMA  Additional Equipment:   Intra-op Plan:   Post-operative Plan: Extubation in OR  Informed Consent: I have reviewed the patients History and Physical, chart, labs and discussed the procedure including the risks, benefits and alternatives for the proposed anesthesia with the patient or authorized representative who has indicated his/her understanding and acceptance.     Dental Advisory Given  Plan Discussed with: CRNA and Anesthesiologist  Anesthesia Plan Comments: (Discussed both nerve block for pain relief post-op and GA; including NV, sore throat, dental  injury, and pulmonary complications)        Anesthesia Quick Evaluation

## 2020-08-01 NOTE — Progress Notes (Signed)
AssistedDr. Oddono with right, ultrasound guided, popliteal, adductor canal block. Side rails up, monitors on throughout procedure. See vital signs in flow sheet. Tolerated Procedure well.  

## 2020-08-01 NOTE — Anesthesia Procedure Notes (Signed)
Anesthesia Regional Block: Adductor canal block   Pre-Anesthetic Checklist: ,, timeout performed, Correct Patient, Correct Site, Correct Laterality, Correct Procedure, Correct Position, site marked, Risks and benefits discussed,  Surgical consent,  Pre-op evaluation,  At surgeon's request and post-op pain management  Laterality: Left  Prep: chloraprep       Needles:  Injection technique: Single-shot  Needle Type: Echogenic Stimulator Needle     Needle Length: 5cm  Needle Gauge: 22     Additional Needles:   Procedures:, nerve stimulator,,, ultrasound used (permanent image in chart),,,,  Narrative:  Start time: 08/01/2020 1:55 PM End time: 08/01/2020 2:00 PM Injection made incrementally with aspirations every 5 mL.  Performed by: Personally  Anesthesiologist: Bethena Midget, MD  Additional Notes: Functioning IV was confirmed and monitors were applied.  A 63mm 22ga Arrow echogenic stimulator needle was used. Sterile prep and drape,hand hygiene and sterile gloves were used. Ultrasound guidance: relevant anatomy identified, needle position confirmed, local anesthetic spread visualized around nerve(s)., vascular puncture avoided.  Image printed for medical record. Negative aspiration and negative test dose prior to incremental administration of local anesthetic. The patient tolerated the procedure well.

## 2020-08-01 NOTE — Anesthesia Procedure Notes (Addendum)
Anesthesia Regional Block: Popliteal block   Pre-Anesthetic Checklist: ,, timeout performed, Correct Patient, Correct Site, Correct Laterality, Correct Procedure, Correct Position, site marked, Risks and benefits discussed,  Surgical consent,  Pre-op evaluation,  At surgeon's request and post-op pain management  Laterality: Left  Prep: chloraprep       Needles:  Injection technique: Single-shot  Needle Type: Echogenic Stimulator Needle     Needle Length: 5cm  Needle Gauge: 22     Additional Needles:   Procedures:, nerve stimulator,,, ultrasound used (permanent image in chart),,,,   Nerve Stimulator or Paresthesia:  Response: foot, 0.45 mA,   Additional Responses:   Narrative:  Start time: 08/01/2020 1:50 PM End time: 08/01/2020 1:55 PM Injection made incrementally with aspirations every 5 mL.  Performed by: Personally  Anesthesiologist: Bethena Midget, MD  Additional Notes: Functioning IV was confirmed and monitors were applied.  A 51mm 22ga Arrow echogenic stimulator needle was used. Sterile prep and drape,hand hygiene and sterile gloves were used. Ultrasound guidance: relevant anatomy identified, needle position confirmed, local anesthetic spread visualized around nerve(s)., vascular puncture avoided.  Image printed for medical record. Negative aspiration and negative test dose prior to incremental administration of local anesthetic. The patient tolerated the procedure well.

## 2020-08-01 NOTE — Op Note (Signed)
08/01/2020  4:58 PM  PATIENT:  Stacey Foster  45 y.o. female  PRE-OPERATIVE DIAGNOSIS:  Right foot lisfranc fracture/dislocation  POST-OPERATIVE DIAGNOSIS:   1. Right 1st and 2nd TMT joint dislocation      2. Right 2nd MT base fracture      3. Right medial inter cuneiform dislocation  Procedure(s): 1.  Open treatment right 1st and 2nd TMT joints with internal fixation 2.  Open treatment of right medial inter cuneiform joint dislocation 3.  Stress exam of right foot under fluoro 4.  AP, lateral and mortise xrays of the right foot  SURGEON:  Toni Arthurs, MD  ASSISTANT: none  ANESTHESIA:   General, regional  EBL:  minimal   TOURNIQUET:   Total Tourniquet Time Documented: Thigh (Right) - 44 minutes Total: Thigh (Right) - 44 minutes  COMPLICATIONS:  None apparent  DISPOSITION:  Extubated, awake and stable to recovery.  INDICATION FOR PROCEDURE: The patient is a 45 year old female without significant past medical history.  She injured her right ago when she stepped on a hose and twisted it.  Radiographs reveal instability at the first and second TMT joints fracture at the second metatarsal base.  She presents now for operative treatment of this displaced and unstable Lisfranc injury.  The risks and benefits of the alternative treatment options have been discussed in detail.  The patient wishes to proceed with surgery and specifically understands risks of bleeding, infection, nerve damage, blood clots, need for additional surgery, amputation and death.  PROCEDURE IN DETAIL:  After pre operative consent was obtained, and the correct operative site was identified, the patient was brought to the operating room and placed supine on the OR table.  Anesthesia was administered.  Pre-operative antibiotics were administered.  A surgical timeout was taken.  The right lower extremity was prepped and draped in standard sterile fashion with a tourniquet around the thigh.  The extremity was elevated  and the tourniquet was inflated to 350 mmHg.  Stress examination was then performed.  Abduction and stress was applied to the forefoot while stabilizing the hindfoot.  Radiographs revealed instability at the first and second TMT joint with widening between the second metatarsal base and medial cuneiform.  Instability was also noted between the medial and middle cuneiforms.  The decision was made to proceed with open treatment.  A longitudinal incision was made over the first and second TMT joints.  Dissection was carried sharply down to the subcutaneous tissues.  The interval between the extensor houses longus and brevis was developed.  The first and second TMT joints were inspected.  The Lisfranc joint was noted to be torn and a small fragment of bone was identified.  This was excised.  The interval was cleaned of all hematoma and reduced.  A tenaculum was placed from the medial cuneiform across to the lateral aspect of the second metatarsal base.  The joint was reduced and compressed.  Radiographs confirmed appropriate reduction of the first and second TMT joints.  K wire was then inserted through a small incision medially from the medial cuneiform into the second metatarsal base.  Radiographs confirmed appropriate position of the wire.  It was overdrilled and removed.  A 4 mm solid fully threaded screw from the [employer] titanium small frag set was inserted and tightened securely.  It was noted to have excellent purchase.  Radiographs confirmed appropriate reduction of the second TMT joint and appropriate position of the screw.  A second guidepin was then inserted from  medial to lateral across the medial intercuneiform joint.  The K wire was overdrilled and removed.  Solid 4 mm screw was inserted and was noted to have excellent purchase.  A template for a stratum 2 hole Lisfranc plate was applied to the dorsum of the first TMT joint.  Appropriate position was confirmed and the tine holes drilled.  The  template was removed and the plate was inserted and impacted into position proximally.  The proximal hole was drilled and a nonlocking screw was inserted.  The 2 distal holes were drilled and nonlocking screws were inserted.  Final AP, lateral and oblique radiographs of the right foot were obtained.  These show appropriate reduction and fixation of the first and second TMT joints and the medial intercuneiform joint.  The wounds were then irrigated copiously and sprinkled with vancomycin powder.  Subcutaneous tissues were approximated with Vicryl.  Skin incisions were closed with nylon.  Sterile dressings were applied followed by well-padded short leg splint.  Tourniquet was released after application of dressings.  The patient was awakened from anesthesia and transported to the recovery room in stable condition.   FOLLOW UP PLAN: Nonweightbearing on the right lower extremity.  Aspirin for DVT prophylaxis.  Follow-up in the office in 2 weeks for suture removal and conversion to a short leg cast.  Plan 6 weeks nonweightbearing immobilization postoperatively.   RADIOGRAPHS: AP, lateral and oblique radiographs of the right foot are obtained intraoperatively.  These show interval reduction and fixation of the first and second TMT joints and the medial intercuneiform joint.  No other acute injuries are noted.
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
## 2020-08-01 NOTE — Anesthesia Procedure Notes (Signed)
Procedure Name: LMA Insertion Date/Time: 08/01/2020 3:22 PM Performed by: Sheryn Bison, CRNA Pre-anesthesia Checklist: Patient identified, Emergency Drugs available, Suction available and Patient being monitored Patient Re-evaluated:Patient Re-evaluated prior to induction Oxygen Delivery Method: Circle System Utilized Preoxygenation: Pre-oxygenation with 100% oxygen Induction Type: IV induction Ventilation: Mask ventilation without difficulty LMA: LMA inserted LMA Size: 4.0 Number of attempts: 1 Airway Equipment and Method: bite block Placement Confirmation: positive ETCO2 Tube secured with: Tape Dental Injury: Teeth and Oropharynx as per pre-operative assessment

## 2020-08-01 NOTE — Transfer of Care (Signed)
Immediate Anesthesia Transfer of Care Note  Patient: Stacey Foster  Procedure(s) Performed: OPEN REDUCTION INTERNAL FIXATION (ORIF) FOOT LISFRANC FRACTURE/DISLOCATION (Right Foot)  Patient Location: PACU  Anesthesia Type:GA combined with regional for post-op pain  Level of Consciousness: drowsy and patient cooperative  Airway & Oxygen Therapy: Patient Spontanous Breathing and Patient connected to face mask oxygen  Post-op Assessment: Report given to RN and Post -op Vital signs reviewed and stable  Post vital signs: Reviewed and stable  Last Vitals:  Vitals Value Taken Time  BP 108/74 08/01/20 1626  Temp    Pulse 79 08/01/20 1627  Resp 15 08/01/20 1627  SpO2 100 % 08/01/20 1627  Vitals shown include unvalidated device data.  Last Pain:  Vitals:   08/01/20 1258  TempSrc: Oral  PainSc: 5       Patients Stated Pain Goal: 5 (08/01/20 1258)  Complications: No complications documented.

## 2020-08-01 NOTE — Discharge Instructions (Addendum)
Toni Arthurs, MD [facility]  Please read the following information regarding your care after surgery.  Medications  You only need a prescription for the narcotic pain medicine (ex. oxycodone, Percocet, Norco).  All of the other medicines listed below are available over the counter. X Aleve 2 pills twice a day for the first 3 days after surgery. ? acetominophen (Tylenol) 650 mg every 4-6 hours as you need for minor to moderate pain X oxycodone as prescribed for severe pain  Narcotic pain medicine (ex. oxycodone, Percocet, Vicodin) will cause constipation.  To prevent this problem, take the following medicines while you are taking any pain medicine. X docusate sodium (Colace) 100 mg twice a day X senna (Senokot) 2 tablets twice a day  X To help prevent blood clots, take a baby aspirin (81 mg) twice a day for two weeks after surgery.  You should also get up every hour while you are awake to move around.    Weight Bearing ? Bear weight when you are able on your operated leg or foot. ? Bear weight only on your operated foot in the post-op shoe. X Do not bear any weight on the operated leg or foot.  Cast / Splint / Dressing X Keep your splint, cast or dressing clean and dry.  Don't put anything (coat hanger, pencil, etc) down inside of it.  If it gets damp, use a hair dryer on the cool setting to dry it.  If it gets soaked, call the office to schedule an appointment for a cast change. ? Remove your dressing 3 days after surgery and cover the incisions with dry dressings.    After your dressing, cast or splint is removed; you may shower, but do not soak or scrub the wound.  Allow the water to run over it, and then gently pat it dry.  Swelling It is normal for you to have swelling where you had surgery.  To reduce swelling and pain, keep your toes above your nose for at least 3 days after surgery.  It may be necessary to keep your foot or leg elevated for several weeks.  If it hurts, it should be  elevated.  Follow Up Call my office at [phone] when you are discharged from the hospital or surgery center to schedule an appointment to be seen two weeks after surgery.  Call my office at [phone] if you develop a fever >101.5 F, nausea, vomiting, bleeding from the surgical site or severe pain.     Post Anesthesia Home Care Instructions  Activity: Get plenty of rest for the remainder of the day. A responsible individual must stay with you for 24 hours following the procedure.  For the next 24 hours, DO NOT: -Drive a car -Advertising copywriter -Drink alcoholic beverages -Take any medication unless instructed by your physician -Make any legal decisions or sign important papers.  Meals: Start with liquid foods such as gelatin or soup. Progress to regular foods as tolerated. Avoid greasy, spicy, heavy foods. If nausea and/or vomiting occur, drink only clear liquids until the nausea and/or vomiting subsides. Call your physician if vomiting continues.  Special Instructions/Symptoms: Your throat may feel dry or sore from the anesthesia or the breathing tube placed in your throat during surgery. If this causes discomfort, gargle with warm salt water. The discomfort should disappear within 24 hours.  If you had a scopolamine patch placed behind your ear for the management of post- operative nausea and/or vomiting:  1. The medication in the patch is  effective for 72 hours, after which it should be removed.  Wrap patch in a tissue and discard in the trash. Wash hands thoroughly with soap and water. 2. You may remove the patch earlier than 72 hours if you experience unpleasant side effects which may include dry mouth, dizziness or visual disturbances. 3. Avoid touching the patch. Wash your hands with soap and water after contact with the patch.    Information for Discharge Teaching: EXPAREL (bupivacaine liposome injectable suspension)   Your surgeon or anesthesiologist gave you  EXPAREL(bupivacaine) to help control your pain after surgery.   EXPAREL is a local anesthetic that provides pain relief by numbing the tissue around the surgical site.  EXPAREL is designed to release pain medication over time and can control pain for up to 72 hours.  Depending on how you respond to EXPAREL, you may require less pain medication during your recovery.  Possible side effects:  Temporary loss of sensation or ability to move in the area where bupivacaine was injected.  Nausea, vomiting, constipation  Rarely, numbness and tingling in your mouth or lips, lightheadedness, or anxiety may occur.  Call your doctor right away if you think you may be experiencing any of these sensations, or if you have other questions regarding possible side effects.  Follow all other discharge instructions given to you by your surgeon or nurse. Eat a healthy diet and drink plenty of water or other fluids.  If you return to the hospital for any reason within 96 hours following the administration of EXPAREL, it is important for health care providers to know that you have received this anesthetic. A teal colored band has been placed on your arm with the date, time and amount of EXPAREL you have received in order to alert and inform your health care providers. Please leave this armband in place for the full 96 hours following administration, and then you may remove the band.Regional Anesthesia Blocks  1. Numbness or the inability to move the "blocked" extremity may last from 3-48 hours after placement. The length of time depends on the medication injected and your individual response to the medication. If the numbness is not going away after 48 hours, call your surgeon.  2. The extremity that is blocked will need to be protected until the numbness is gone and the  Strength has returned. Because you cannot feel it, you will need to take extra care to avoid injury. Because it may be weak, you may have  difficulty moving it or using it. You may not know what position it is in without looking at it while the block is in effect.  3. For blocks in the legs and feet, returning to weight bearing and walking needs to be done carefully. You will need to wait until the numbness is entirely gone and the strength has returned. You should be able to move your leg and foot normally before you try and bear weight or walk. You will need someone to be with you when you first try to ensure you do not fall and possibly risk injury.  4. Bruising and tenderness at the needle site are common side effects and will resolve in a few days.  5. Persistent numbness or new problems with movement should be communicated to the surgeon or the [facility] [phone] [place] [phone]).

## 2020-08-02 ENCOUNTER — Encounter (HOSPITAL_BASED_OUTPATIENT_CLINIC_OR_DEPARTMENT_OTHER): Payer: Self-pay | Admitting: Orthopedic Surgery

## 2020-08-02 NOTE — Anesthesia Postprocedure Evaluation (Signed)
Anesthesia Post Note  Patient: Stacey Foster  Procedure(s) Performed: OPEN REDUCTION INTERNAL FIXATION (ORIF) FOOT LISFRANC FRACTURE/DISLOCATION (Right Foot)     Patient location during evaluation: PACU Anesthesia Type: General and Regional Level of consciousness: awake and alert Pain management: pain level controlled Vital Signs Assessment: post-procedure vital signs reviewed and stable Respiratory status: spontaneous breathing, nonlabored ventilation, respiratory function stable and patient connected to nasal cannula oxygen Cardiovascular status: blood pressure returned to baseline and stable Postop Assessment: no apparent nausea or vomiting Anesthetic complications: no   No complications documented.  Last Vitals:  Vitals:   08/01/20 1645 08/01/20 1700  BP: 118/64 127/80  Pulse: 76 74  Resp: 15 16  Temp:  36.7 C  SpO2: 100% 99%    Last Pain:  Vitals:   08/01/20 1700  TempSrc:   PainSc: 0-No pain                 Barbarann Kelly
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
## 2020-09-19 ENCOUNTER — Ambulatory Visit: Payer: BC Managed Care – PPO | Admitting: Obstetrics and Gynecology

## 2020-12-03 ENCOUNTER — Ambulatory Visit: Payer: BC Managed Care – PPO | Admitting: Nurse Practitioner

## 2020-12-09 ENCOUNTER — Other Ambulatory Visit: Payer: Self-pay | Admitting: Obstetrics and Gynecology

## 2020-12-09 DIAGNOSIS — Z1231 Encounter for screening mammogram for malignant neoplasm of breast: Secondary | ICD-10-CM

## 2020-12-13 ENCOUNTER — Ambulatory Visit
Admission: RE | Admit: 2020-12-13 | Discharge: 2020-12-13 | Disposition: A | Discharge status: Home / Self Care | Payer: BC Managed Care – PPO | Source: Ambulatory Visit | Attending: Obstetrics and Gynecology | Admitting: Obstetrics and Gynecology

## 2020-12-13 ENCOUNTER — Other Ambulatory Visit: Payer: Self-pay

## 2020-12-13 DIAGNOSIS — Z1231 Encounter for screening mammogram for malignant neoplasm of breast: Secondary | ICD-10-CM

## 2020-12-31 ENCOUNTER — Ambulatory Visit (INDEPENDENT_AMBULATORY_CARE_PROVIDER_SITE_OTHER): Payer: BC Managed Care – PPO | Admitting: Obstetrics and Gynecology

## 2020-12-31 ENCOUNTER — Other Ambulatory Visit: Payer: Self-pay

## 2020-12-31 ENCOUNTER — Encounter: Payer: Self-pay | Admitting: Obstetrics and Gynecology

## 2020-12-31 VITALS — BP 118/76 | HR 95 | Ht 64.5 in | Wt 173.0 lb

## 2020-12-31 DIAGNOSIS — Z01419 Encounter for gynecological examination (general) (routine) without abnormal findings: Secondary | ICD-10-CM
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
## 2020-12-31 NOTE — Patient Instructions (Signed)

## 2021-09-22 ENCOUNTER — Encounter: Payer: Self-pay | Admitting: Podiatrist

## 2021-09-22 ENCOUNTER — Ambulatory Visit: Payer: BC Managed Care – PPO | Admitting: Podiatrist

## 2021-09-22 DIAGNOSIS — B070 Plantar wart: Secondary | ICD-10-CM

## 2021-09-22 MED ORDER — FLUOROURACIL 5 % EX CREA: TOPICAL_CREAM | Freq: Two times a day (BID) | CUTANEOUS | 0 refills | Status: AC

## 2021-09-22 NOTE — Progress Notes (Signed)
?  Chief Complaint  ?Stacey Foster presents with  ? Plantar Warts  ?  ? ?HPI: Stacey Foster is 46 y.o. female who presents today for a painful plantar wart submetatarsal 3/4 of the left foot since November 2022.  She states its been treated by her dermatologist where they frozen it a couple times she injected yeast under it and she is tried salicylic acid.  She states it is grown twice the size recently and it is painful when walking.  She also has a small lesion on her right lesser fourth toe and she is concerned it might also be a wart ? ?Stacey Foster Active Problem List  ? Diagnosis Date Noted  ? Fracture of T3 vertebra (HCC) 09/27/2012  ? Fracture of T4 vertebra (HCC) 09/27/2012  ? Hemothorax, right 09/27/2012  ? ? ?Current Outpatient Medications on File Prior to Visit  ?Medication Sig Dispense Refill  ? desvenlafaxine (PRISTIQ) 50 MG 24 hr tablet Take 50 mg by mouth daily.    ? ibuprofen (ADVIL,MOTRIN) 200 MG tablet Take 400 mg by mouth daily as needed for pain.    ? levonorgestrel (MIRENA) 20 MCG/24HR IUD 1 each by Intrauterine route once.    ? omeprazole (PRILOSEC) 20 MG capsule Take 1 capsule by mouth 2 (two) times daily.    ? ?No current facility-administered medications on file prior to visit.  ? ? ?No Known Allergies ? ?Review of Systems ?No fevers, chills, nausea, muscle aches, no difficulty breathing, no calf pain, no chest pain or shortness of breath. ? ? ?Physical Exam ? ?GENERAL APPEARANCE: Alert, conversant. Appropriately groomed. No acute distress.  ? ?VASCULAR: Pedal pulses palpable DP and PT bilateral.  Capillary refill time is immediate to all digits,  Proximal to distal cooling it warm to warm.  Digital perfusion adequate.  ? ?NEUROLOGIC: sensation is intact to 5.07 monofilament at 5/5 sites bilateral.  Light touch is intact bilateral, vibratory sensation intact bilateral ? ?MUSCULOSKELETAL: acceptable muscle strength, tone and stability bilateral.  No gross boney pedal deformities noted.  No pain, crepitus or  limitation noted with foot and ankle range of motion bilateral.  ? ?DERMATOLOGIC: skin is warm, supple, and dry.  Well-circumscribed lesion measuring 0.7 cm in diameter is present on the plantar aspect of the left foot.  Pain with direct pressure is noted.  Multiple capillary budding is noted throughout.  Right foot lateral aspect of the fourth toe has a well-circumscribed area that appears to be a porokeratotic lesion versus very small wart. ? ? ? ? ?Assessment  ? ?  ICD-10-CM   ?1. Plantar wart of left foot  B07.0   ?  ? ? ? ?Plan ? ?Discussed past treatments as well as various treatment options and alternatives for plantar warts.  I recommended paring the wart and applying Canthacur to the lesion and this was accomplished today without complication on both the plantar wart of the left foot and small lesion of the right toe.  I called in a prescription for Efudex for her to start using in a couple weeks.  I also dispensed aftercare instructions following Canthacur treatment and discussed that the area could blister and gave instructions for what to do if this happens.  She will be seen back in 3 weeks for recheck and further treatment at that time. ?

## 2021-09-22 NOTE — Patient Instructions (Signed)
AFTERCARE INSTRUCTIONS FOR CANTHARIDIN (WART) TREATMENT: ? ?1:  Leave the tape on the affected area for 24 hours after treatment-  do not get the foot wet. ?** if you experience any pain, burning, or discomfort wash the area sooner than 24 hours.  ? ?2. After 24 hours- remove the tape and wash the wart with soap and water ? ?3.  Expect blistering the day after treatment-  the blister may enlarge and fill with fluid-  if this occurs you may make a small hole in the blister using a sterile needle (can also dip a pin or sewing needle in rubbing alcohol and wipe dry) and squeeze fluid out by gently squeezing blister with clean hands. You may also apply a cool compress and take oral pain medications (tylenol or advil) for pain. ? ?4.  Do not remove the top of the blister.  Apply vaseline twice daily for 5-10 days or until healing occurs.  A crusty lesion will occur and this will fall off on its own after 4-5 days. ? ?5.  After skin healing has occurred after cantharidin treatment, apply Effudex cream to the base of the lesion once a day for 5-7 days as tolerated    ? ?6.  The treated area may look red or may itch.  Call the office if there is still significant burning, stinging or pain after the blister is popped.  ? ?7.  It is rare, but anyone can have a severe allergic reaction to any medication-  call 911 if you have a severe reaction (trouble breathing, hives, trouble swallowing) after treatment.  ? ?

## 2021-10-02 ENCOUNTER — Encounter: Payer: Self-pay | Admitting: Podiatrist

## 2021-10-03 NOTE — Telephone Encounter (Signed)
Please advise 

## 2021-10-13 ENCOUNTER — Ambulatory Visit: Payer: BC Managed Care – PPO | Admitting: Podiatrist

## 2021-10-13 DIAGNOSIS — B070 Plantar wart: Secondary | ICD-10-CM | POA: Diagnosis not present

## 2021-10-13 NOTE — Patient Instructions (Signed)
Get some Lambswool to put between your toes on the right foot to help with pain from the small callus

## 2021-10-20 ENCOUNTER — Encounter: Payer: Self-pay | Admitting: Podiatrist

## 2021-10-20 NOTE — Progress Notes (Signed)
Chief Complaint  Patient presents with   Plantar Warts     HPI: Patient is 46 y.o. female who presents today for follow up on painful plantar wart plantar lateral aspect of her left foot. She relates the area scabbed over after the application of cantharone.  She also relates the pain has subsided.     No Known Allergies  Review of systems is negative except as noted in the HPI.  Denies nausea/ vomiting/ fevers/ chills or night sweats.   Denies difficulty breathing, denies calf pain or tenderness  Physical Exam  Patient is awake, alert, and oriented x 3.  In no acute distress.    Vascular status is intact with palpable pedal pulses DP and PT bilateral and capillary refill time less than 3 seconds bilateral.  No edema or erythema noted.   Neurological exam reveals epicritic and protective sensation grossly intact bilateral.   Dermatological exam reveals large scab that has formed over the area of the wart submet 3/4 left.  Once the skin is pared away, the area of the wart is much improved.  There are a couple of areas of capillary budding however the lesion is mostly resolved after  the cantharone treatment.  The small lesion on the right fourth toe is evaluated again, and it does appear to be a small corn.   Musculoskeletal exam: Musculature intact with dorsiflexion, plantarflexion, inversion, eversion. Ankle and First MPJ joint range of motion normal.     Assessment:   ICD-10-CM   1. Plantar wart of left foot  B07.0        Plan: Exam findings discussed with Shelda and I recommended one more application of cantharone.  She agreed and I pared the area to prepare the lesion for the treatment.  I then applied a light film of cantharone to the lesion and applied mole skin.  I recommended she remove the moleskin and wash the foot in 24 hours.  She will watch for any signs of recurrence of the lesion and call if any are noticed.  I am hopeful this lesion will resolve after this treatment.

## 2021-11-17 ENCOUNTER — Other Ambulatory Visit: Payer: Self-pay | Admitting: Obstetrics and Gynecology

## 2021-11-17 DIAGNOSIS — Z1231 Encounter for screening mammogram for malignant neoplasm of breast: Secondary | ICD-10-CM

## 2021-12-15 ENCOUNTER — Ambulatory Visit
Admission: RE | Admit: 2021-12-15 | Discharge: 2021-12-15 | Disposition: A | Discharge status: Home / Self Care | Payer: BC Managed Care – PPO | Source: Ambulatory Visit | Attending: Obstetrics and Gynecology | Admitting: Obstetrics and Gynecology

## 2021-12-15 DIAGNOSIS — Z1231 Encounter for screening mammogram for malignant neoplasm of breast: Secondary | ICD-10-CM

## 2021-12-16 ENCOUNTER — Other Ambulatory Visit: Payer: Self-pay | Admitting: Obstetrics and Gynecology

## 2021-12-16 DIAGNOSIS — N6489 Other specified disorders of breast: Secondary | ICD-10-CM

## 2021-12-24 ENCOUNTER — Other Ambulatory Visit: Payer: Self-pay | Admitting: Obstetrics and Gynecology

## 2021-12-24 ENCOUNTER — Ambulatory Visit
Admission: RE | Admit: 2021-12-24 | Discharge: 2021-12-24 | Disposition: A | Discharge status: Home / Self Care | Payer: BC Managed Care – PPO | Source: Ambulatory Visit | Attending: Obstetrics and Gynecology | Admitting: Obstetrics and Gynecology

## 2021-12-24 DIAGNOSIS — N6489 Other specified disorders of breast: Secondary | ICD-10-CM

## 2021-12-24 DIAGNOSIS — N6310 Unspecified lump in the right breast, unspecified quadrant: Secondary | ICD-10-CM

## 2021-12-26 ENCOUNTER — Other Ambulatory Visit: Payer: BC Managed Care – PPO

## 2022-03-12 IMAGING — MG DIGITAL SCREENING BILAT W/ TOMO W/ CAD
8 series · 8 of 24 positions shown · non-contrast
Comparison: Previous exam(s).

CLINICAL DATA: Screening.

EXAM:
DIGITAL SCREENING BILATERAL MAMMOGRAM WITH TOMO AND CAD

[L CC synth-2D]
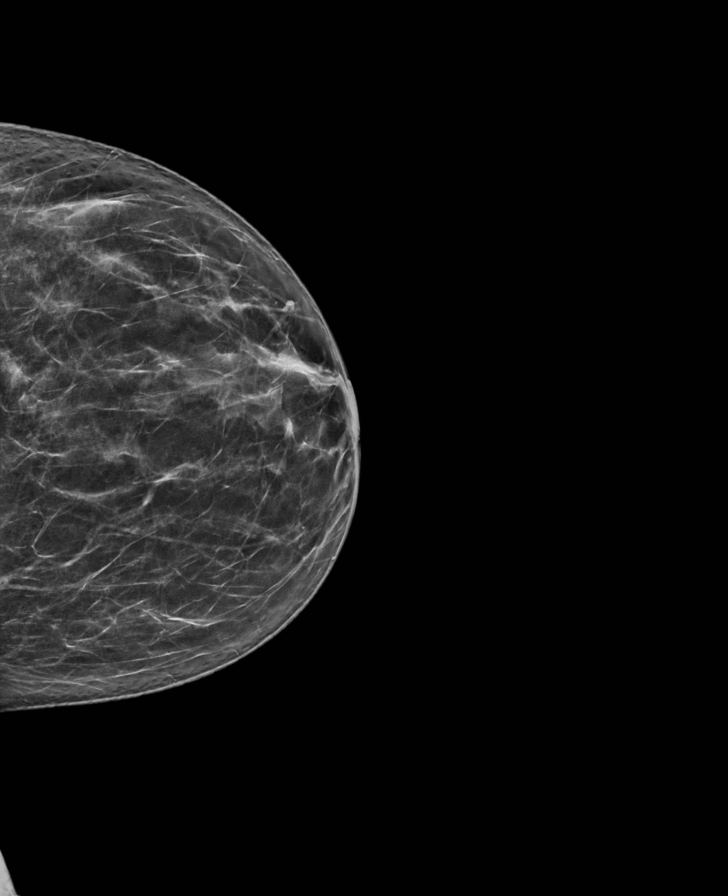

[R CC synth-2D]
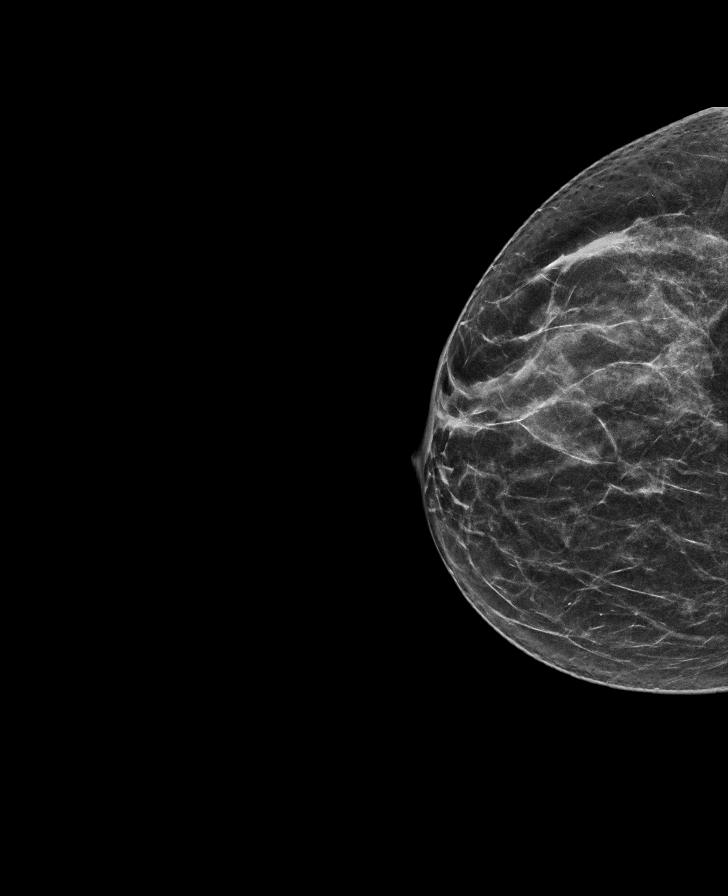

[L MLO synth-2D]
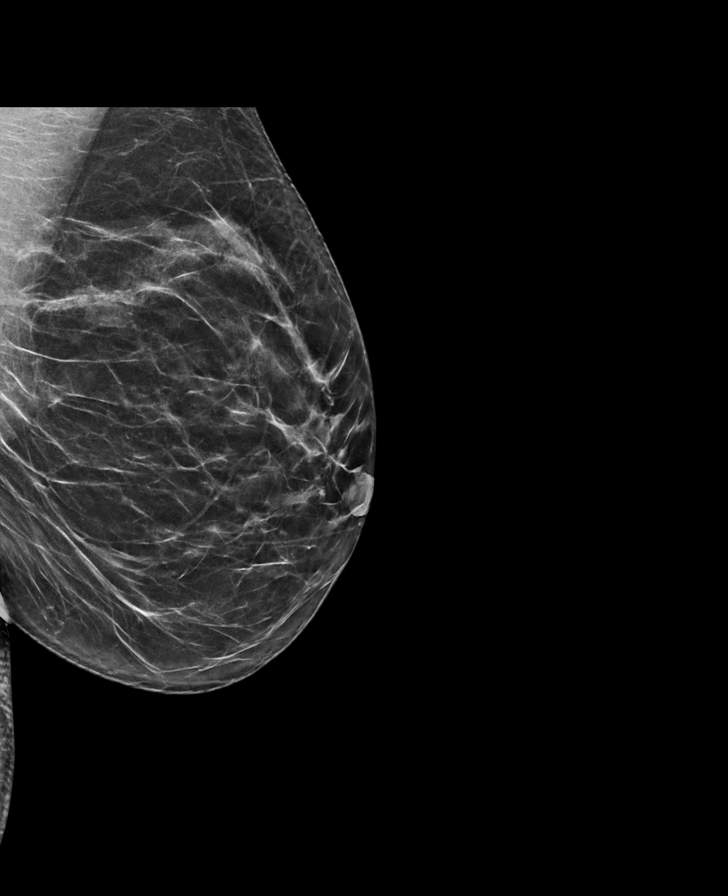

[R MLO synth-2D]
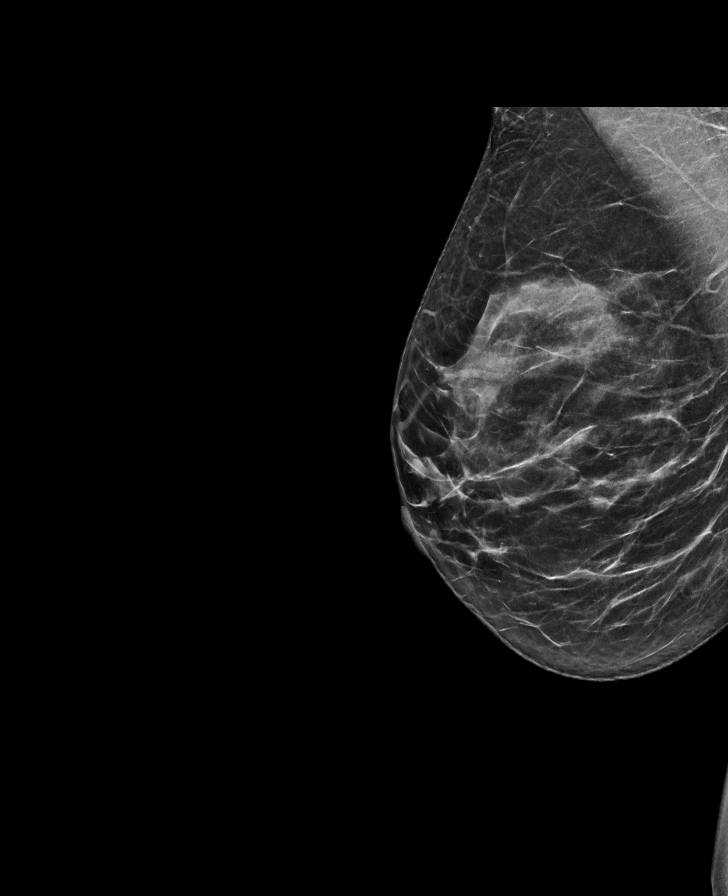

[R MLO tomo · tomo slice 34/67.0]
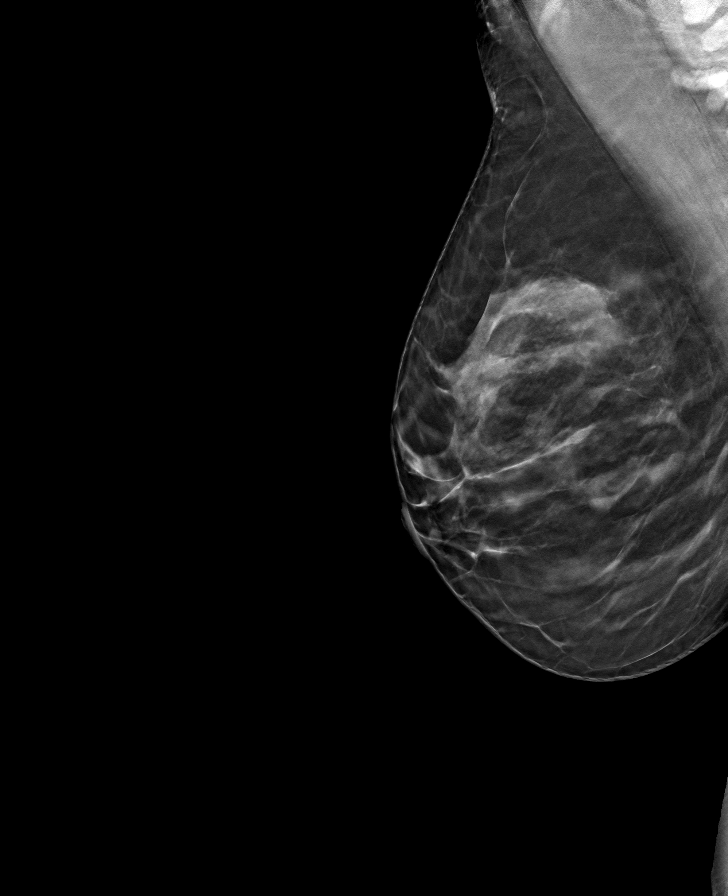

[R CC tomo · tomo slice 31/60.0]
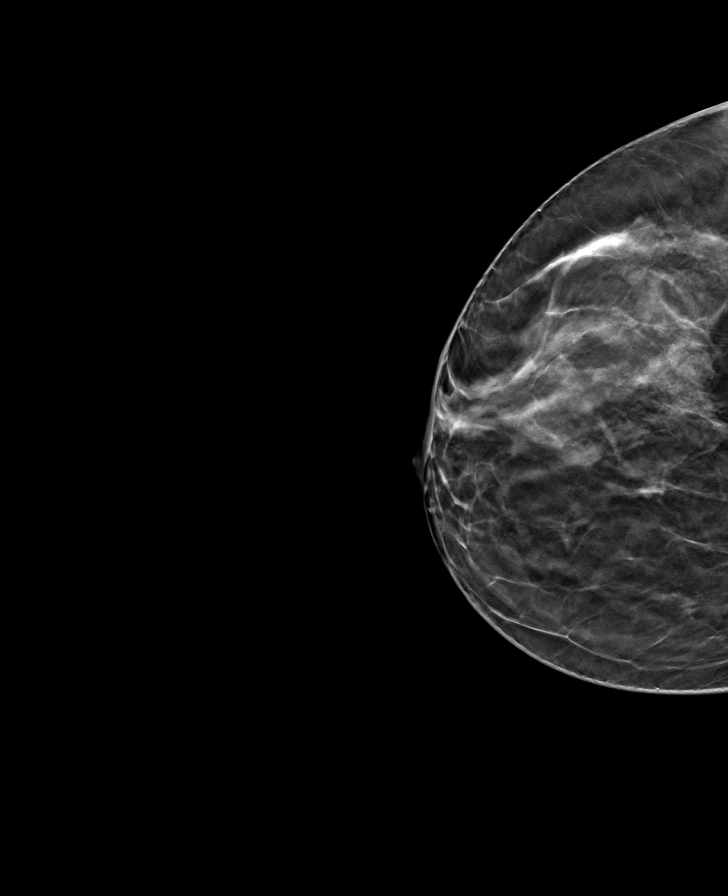

[L MLO tomo · tomo slice 35/68.0]
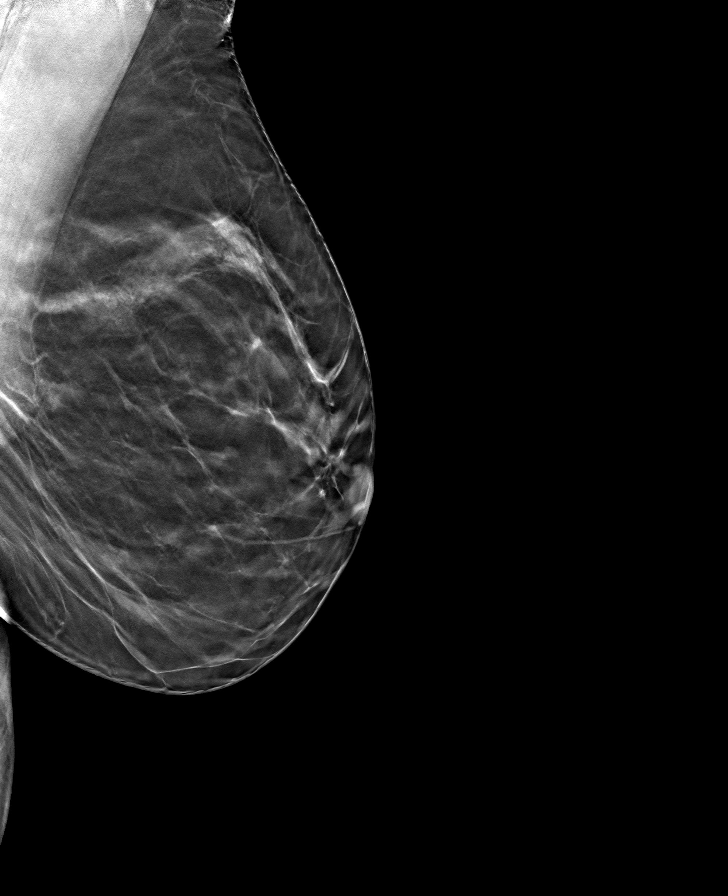

[L CC tomo · tomo slice 34/67.0]
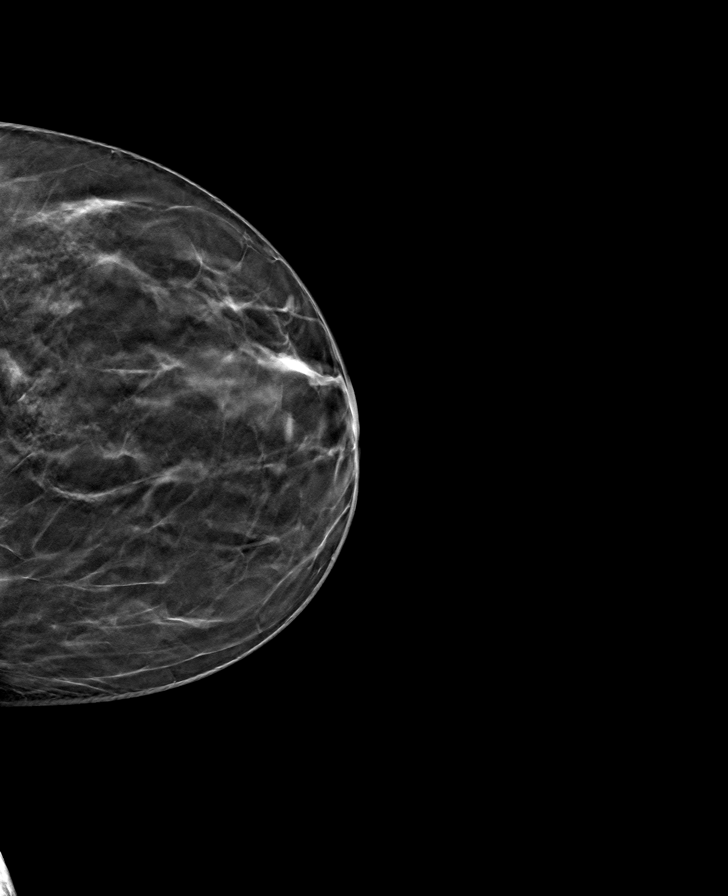

[8 of 24 positions shown; findings below may reference images not displayed]

ACR Breast Density Category b: There are scattered areas of
fibroglandular density.
FINDINGS: There are no findings suspicious for malignancy. Images were
processed with CAD.
IMPRESSION: No mammographic evidence of malignancy. A result letter of this
screening mammogram will be mailed directly to the patient.

RECOMMENDATION:
Screening mammogram in one year. (Code:[id number])

BI-RADS CATEGORY  1: Negative.

## 2022-06-29 ENCOUNTER — Ambulatory Visit
Admission: RE | Admit: 2022-06-29 | Discharge: 2022-06-29 | Disposition: A | Discharge status: Home / Self Care | Payer: BC Managed Care – PPO | Source: Ambulatory Visit | Attending: Obstetrics and Gynecology | Admitting: Obstetrics and Gynecology

## 2022-06-29 DIAGNOSIS — N6310 Unspecified lump in the right breast, unspecified quadrant: Secondary | ICD-10-CM

## 2022-11-13 ENCOUNTER — Other Ambulatory Visit: Payer: Self-pay | Admitting: Obstetrics and Gynecology

## 2022-11-13 DIAGNOSIS — Z1231 Encounter for screening mammogram for malignant neoplasm of breast: Secondary | ICD-10-CM

## 2022-12-21 ENCOUNTER — Ambulatory Visit: Admission: RE | Admit: 2022-12-21 | Payer: BC Managed Care – PPO | Source: Ambulatory Visit

## 2022-12-21 DIAGNOSIS — Z1231 Encounter for screening mammogram for malignant neoplasm of breast: Secondary | ICD-10-CM

## 2023-09-02 ENCOUNTER — Encounter: Payer: Self-pay | Admitting: Internal Medicine

## 2023-10-04 ENCOUNTER — Ambulatory Visit (AMBULATORY_SURGERY_CENTER)

## 2023-10-04 ENCOUNTER — Encounter: Payer: Self-pay | Admitting: Internal Medicine

## 2023-10-04 VITALS — Ht 64.5 in | Wt 142.0 lb

## 2023-10-04 DIAGNOSIS — Z1211 Encounter for screening for malignant neoplasm of colon: Secondary | ICD-10-CM

## 2023-10-04 MED ORDER — NA SULFATE-K SULFATE-MG SULF 17.5-3.13-1.6 GM/177ML PO SOLN: 1.0000 | Freq: Once | ORAL | 0 refills | Status: AC

## 2023-10-04 NOTE — Patient Instructions (Signed)
 Hold all NSAIDS to include Meloxicam, Motrin, Celebrex, Anaprox, Naproxen, Advil, Ibuprofen, Aleve, Diclofenac, BC Powders, Goody Powders etc for 7 days prior to the colonoscopy- you may take Tylenol  (Acetaminophen ) products.   Take allowed medicines by 4:30 AM the day of your procedure  ONCE A WEEK INJECTIONS Ozempic,  Mounjaro, Wegovy, Trulicity, Tanzeum, Byetta, Victoza, Bydureon, & SymlinPen  -DO NOT TAKE 7 days prior to the procedure.  Last dose on or before Thursday 5/22 failure to hold this medication will result in a cancellation or rescheduling of your procedu

## 2023-10-04 NOTE — Progress Notes (Signed)

## 2023-10-15 ENCOUNTER — Other Ambulatory Visit: Payer: Self-pay | Admitting: Medical Genetics

## 2023-10-19 ENCOUNTER — Other Ambulatory Visit: Payer: Self-pay
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
## 2023-10-22 ENCOUNTER — Encounter: Payer: Self-pay | Admitting: Internal Medicine

## 2023-10-22 ENCOUNTER — Ambulatory Visit: Admitting: Internal Medicine

## 2023-10-22 VITALS — BP 97/45 | HR 71 | Temp 98.2°F | Resp 14 | Ht 64.5 in | Wt 142.0 lb

## 2023-10-22 DIAGNOSIS — K5730 Diverticulosis of large intestine without perforation or abscess without bleeding: Secondary | ICD-10-CM

## 2023-10-22 DIAGNOSIS — Z1211 Encounter for screening for malignant neoplasm of colon: Secondary | ICD-10-CM | POA: Diagnosis present

## 2023-10-22 MED ORDER — SODIUM CHLORIDE 0.9 % IV SOLN: 500.0000 mL | Freq: Once | INTRAVENOUS | Status: DC

## 2023-10-22 NOTE — Progress Notes (Signed)
 Report to PACU, RN, vss, BBS= Clear.

## 2023-10-22 NOTE — Progress Notes (Signed)
 Pt's states no medical or surgical changes since previsit or office visit.

## 2023-10-22 NOTE — Op Note (Signed)
 [facility] Patient Name: Stacey Foster Procedure Date: 10/22/2023 8:40 AM MRN: [id number] Endoscopist: Kenney Peacemaker , MD, [id number] Age: 48 Referring MD:  Date of Birth: Nov 04, 1975 Gender: Female Account #: [id number] Procedure:                Colonoscopy Indications:              Screening for colorectal malignant neoplasm, This                            is the patient's first colonoscopy Medicines:                Monitored Anesthesia Care Procedure:                Pre-Anesthesia Assessment:                           - Prior to the procedure, a History and Physical                            was performed, and patient medications and                            allergies were reviewed. The patient's tolerance of                            previous anesthesia was also reviewed. The risks                            and benefits of the procedure and the sedation                            options and risks were discussed with the patient.                            All questions were answered, and informed consent                            was obtained. Prior Anticoagulants: The patient has                            taken no anticoagulant or antiplatelet agents. ASA                            Grade Assessment: II - A patient with mild systemic                            disease. After reviewing the risks and benefits,                            the patient was deemed in satisfactory condition to                            undergo the procedure.  After obtaining informed consent, the colonoscope                            was passed under direct vision. Throughout the                            procedure, the patient's blood pressure, pulse, and                            oxygen saturations were monitored continuously. The                            Olympus Scope SN [id number] was introduced through the                            anus and advanced to  the the cecum, identified by                            appendiceal orifice and ileocecal valve. The                            colonoscopy was performed without difficulty. The                            patient tolerated the procedure well. The quality                            of the bowel preparation was good. The ileocecal                            valve, appendiceal orifice, and rectum were                            photographed. The bowel preparation used was SUPREP                            via split dose instruction. Scope In: 8:53:49 AM Scope Out: 9:06:56 AM Scope Withdrawal Time: 0 hours 9 minutes 39 seconds  Total Procedure Duration: 0 hours 13 minutes 7 seconds  Findings:                 The perianal and digital rectal examinations were                            normal.                           Multiple Large and small diverticula were found in                            the sigmoid colon.                           The exam was otherwise without abnormality on  direct and retroflexion views. Complications:            No immediate complications. Estimated Blood Loss:     Estimated blood loss: none. Impression:               - Diverticulosis in the sigmoid colon.                           - The examination was otherwise normal on direct                            and retroflexion views.                           - No specimens collected. Recommendation:           - Patient has a contact number available for                            emergencies. The signs and symptoms of potential                            delayed complications were discussed with the                            patient. Return to normal activities tomorrow.                            Written discharge instructions were provided to the                            patient.                           - Resume previous diet.                           - Continue present  medications.                           - Repeat colonoscopy in 10 years for screening                            purposes. Kenney Peacemaker, MD 10/22/2023 9:13:50 AM This report has been signed electronically.

## 2023-10-22 NOTE — Patient Instructions (Addendum)
 No polyps or cancer were seen. You do have diverticulosis, a common condition that is usually not a problem.  Please read the handout.  Next routine colonoscopy or other screening test in 10 years - 2035.  I appreciate the opportunity to care for you. Kenney Peacemaker, MD, [facility]  Discharge instructions given. Handout on Diverticulosis. Resume previous medications. YOU HAD AN ENDOSCOPIC PROCEDURE TODAY AT THE [facility]:   Refer to the procedure report that was given to you for any specific questions about what was found during the examination.  If the procedure report does not answer your questions, please call your gastroenterologist to clarify.  If you requested that your care partner not be given the details of your procedure findings, then the procedure report has been included in a sealed envelope for you to review at your convenience later.  YOU SHOULD EXPECT: Some feelings of bloating in the abdomen. Passage of more gas than usual.  Walking can help get rid of the air that was put into your GI tract during the procedure and reduce the bloating. If you had a lower endoscopy (such as a colonoscopy or flexible sigmoidoscopy) you may notice spotting of blood in your stool or on the toilet paper. If you underwent a bowel prep for your procedure, you may not have a normal bowel movement for a few days.  Please Note:  You might notice some irritation and congestion in your nose or some drainage.  This is from the oxygen used during your procedure.  There is no need for concern and it should clear up in a day or so.  SYMPTOMS TO REPORT IMMEDIATELY:  Following lower endoscopy (colonoscopy or flexible sigmoidoscopy):  Excessive amounts of blood in the stool  Significant tenderness or worsening of abdominal pains  Swelling of the abdomen that is new, acute  Fever of 100F or higher   For urgent or emergent issues, a gastroenterologist can be reached at any hour by calling [phone]  [phone]. Do not use MyChart messaging for urgent concerns.    DIET:  We do recommend a small meal at first, but then you may proceed to your regular diet.  Drink plenty of fluids but you should avoid alcoholic beverages for 24 hours.  ACTIVITY:  You should plan to take it easy for the rest of today and you should NOT DRIVE or use heavy machinery until tomorrow (because of the sedation medicines used during the test).    FOLLOW UP: Our staff will call the number listed on your records the next business day following your procedure.  We will call around 7:15- 8:00 am to check on you and address any questions or concerns that you may have regarding the information given to you following your procedure. If we do not reach you, we will leave a message.     If any biopsies were taken you will be contacted by phone or by letter within the next 1-3 weeks.  Please call us  at [phone] if you have not heard about the biopsies in 3 weeks.    SIGNATURES/CONFIDENTIALITY: You and/or your care partner have signed paperwork which will be entered into your electronic medical record.  These signatures attest to the fact that that the information above on your After Visit Summary has been reviewed and is understood.  Full responsibility of the confidentiality of this discharge information lies with you and/or your care-partner.

## 2023-10-25 ENCOUNTER — Telehealth: Payer: Self-pay

## 2023-10-25 NOTE — Telephone Encounter (Signed)
 Left message on answering machine.

## 2023-12-01 ENCOUNTER — Other Ambulatory Visit: Payer: Self-pay | Admitting: Obstetrics and Gynecology

## 2023-12-01 DIAGNOSIS — Z1231 Encounter for screening mammogram for malignant neoplasm of breast: Secondary | ICD-10-CM

## 2023-12-23 ENCOUNTER — Ambulatory Visit
Admission: RE | Admit: 2023-12-23 | Discharge: 2023-12-23 | Disposition: A | Discharge status: Home / Self Care | Source: Ambulatory Visit | Attending: Obstetrics and Gynecology | Admitting: Obstetrics and Gynecology

## 2023-12-23 DIAGNOSIS — Z1231 Encounter for screening mammogram for malignant neoplasm of breast: Secondary | ICD-10-CM

## 2023-12-28 ENCOUNTER — Other Ambulatory Visit: Payer: Self-pay | Admitting: Obstetrics and Gynecology

## 2023-12-28 DIAGNOSIS — R928 Other abnormal and inconclusive findings on diagnostic imaging of breast: Secondary | ICD-10-CM

## 2023-12-28 DIAGNOSIS — N6489 Other specified disorders of breast: Secondary | ICD-10-CM

## 2024-01-03 ENCOUNTER — Ambulatory Visit
Admission: RE | Admit: 2024-01-03 | Discharge: 2024-01-03 | Disposition: A | Discharge status: Home / Self Care | Source: Ambulatory Visit | Attending: Obstetrics and Gynecology | Admitting: Obstetrics and Gynecology

## 2024-01-03 ENCOUNTER — Ambulatory Visit

## 2024-01-03 DIAGNOSIS — R928 Other abnormal and inconclusive findings on diagnostic imaging of breast: Secondary | ICD-10-CM

## 2024-01-03 DIAGNOSIS — N6489 Other specified disorders of breast: Secondary | ICD-10-CM

## 2024-03-26 ENCOUNTER — Other Ambulatory Visit: Payer: Self-pay | Admitting: Medical Genetics

## 2024-03-26 DIAGNOSIS — Z006 Encounter for examination for normal comparison and control in clinical research program: Secondary | ICD-10-CM
